# Patient Record
Sex: Male | Born: 1940
Health system: Southern US, Community
[De-identification: ages and names within clinical notes are randomized; demographics above are authoritative.]

## PROBLEM LIST (undated history)

## (undated) DIAGNOSIS — N400 Enlarged prostate without lower urinary tract symptoms: Secondary | ICD-10-CM

## (undated) DIAGNOSIS — Z87442 Personal history of urinary calculi: Secondary | ICD-10-CM

## (undated) DIAGNOSIS — N210 Calculus in bladder: Secondary | ICD-10-CM

## (undated) HISTORY — PX: OTHER SURGICAL HISTORY: SHX169

## (undated) HISTORY — PX: COLONOSCOPY W/ POLYPECTOMY: SHX1380

---

## 2000-07-29 ENCOUNTER — Ambulatory Visit (HOSPITAL_COMMUNITY): Admission: RE | Admit: 2000-07-29 | Discharge: 2000-07-29 | Payer: Self-pay | Admitting: *Deleted

## 2017-03-19 DIAGNOSIS — R69 Illness, unspecified: Secondary | ICD-10-CM | POA: Diagnosis not present

## 2017-10-08 ENCOUNTER — Other Ambulatory Visit: Payer: Self-pay | Admitting: Family Medicine

## 2017-10-08 DIAGNOSIS — R1012 Left upper quadrant pain: Secondary | ICD-10-CM | POA: Diagnosis not present

## 2017-10-08 DIAGNOSIS — R1084 Generalized abdominal pain: Secondary | ICD-10-CM

## 2017-10-08 DIAGNOSIS — K909 Intestinal malabsorption, unspecified: Secondary | ICD-10-CM | POA: Diagnosis not present

## 2017-10-08 DIAGNOSIS — R109 Unspecified abdominal pain: Secondary | ICD-10-CM | POA: Diagnosis not present

## 2017-10-16 ENCOUNTER — Ambulatory Visit
Admission: RE | Admit: 2017-10-16 | Discharge: 2017-10-16 | Disposition: A | Discharge status: Home / Self Care | Payer: Medicare HMO | Source: Ambulatory Visit | Attending: Family Medicine | Admitting: Family Medicine

## 2017-10-16 DIAGNOSIS — R1084 Generalized abdominal pain: Secondary | ICD-10-CM | POA: Diagnosis not present

## 2017-12-08 DIAGNOSIS — R69 Illness, unspecified: Secondary | ICD-10-CM | POA: Diagnosis not present

## 2018-03-18 DIAGNOSIS — R69 Illness, unspecified: Secondary | ICD-10-CM | POA: Diagnosis not present

## 2018-03-23 DIAGNOSIS — H25043 Posterior subcapsular polar age-related cataract, bilateral: Secondary | ICD-10-CM | POA: Diagnosis not present

## 2018-03-23 DIAGNOSIS — H2513 Age-related nuclear cataract, bilateral: Secondary | ICD-10-CM | POA: Diagnosis not present

## 2018-03-23 DIAGNOSIS — H18413 Arcus senilis, bilateral: Secondary | ICD-10-CM | POA: Diagnosis not present

## 2018-03-23 DIAGNOSIS — H2511 Age-related nuclear cataract, right eye: Secondary | ICD-10-CM | POA: Diagnosis not present

## 2018-03-23 DIAGNOSIS — H02831 Dermatochalasis of right upper eyelid: Secondary | ICD-10-CM | POA: Diagnosis not present

## 2018-03-23 DIAGNOSIS — H25013 Cortical age-related cataract, bilateral: Secondary | ICD-10-CM | POA: Diagnosis not present

## 2018-03-25 DIAGNOSIS — R739 Hyperglycemia, unspecified: Secondary | ICD-10-CM | POA: Diagnosis not present

## 2018-03-25 DIAGNOSIS — N400 Enlarged prostate without lower urinary tract symptoms: Secondary | ICD-10-CM | POA: Diagnosis not present

## 2018-03-25 DIAGNOSIS — R7309 Other abnormal glucose: Secondary | ICD-10-CM | POA: Diagnosis not present

## 2018-03-25 DIAGNOSIS — K635 Polyp of colon: Secondary | ICD-10-CM | POA: Diagnosis not present

## 2018-03-25 DIAGNOSIS — Z125 Encounter for screening for malignant neoplasm of prostate: Secondary | ICD-10-CM | POA: Diagnosis not present

## 2018-03-25 DIAGNOSIS — Z0000 Encounter for general adult medical examination without abnormal findings: Secondary | ICD-10-CM | POA: Diagnosis not present

## 2018-03-25 DIAGNOSIS — R1012 Left upper quadrant pain: Secondary | ICD-10-CM | POA: Diagnosis not present

## 2018-04-07 DIAGNOSIS — R1012 Left upper quadrant pain: Secondary | ICD-10-CM | POA: Diagnosis not present

## 2018-04-07 DIAGNOSIS — Z86010 Personal history of colonic polyps: Secondary | ICD-10-CM | POA: Diagnosis not present

## 2018-04-12 DIAGNOSIS — H52201 Unspecified astigmatism, right eye: Secondary | ICD-10-CM | POA: Diagnosis not present

## 2018-04-12 DIAGNOSIS — H2511 Age-related nuclear cataract, right eye: Secondary | ICD-10-CM | POA: Diagnosis not present

## 2018-04-13 DIAGNOSIS — H2512 Age-related nuclear cataract, left eye: Secondary | ICD-10-CM | POA: Diagnosis not present

## 2018-04-28 DIAGNOSIS — K635 Polyp of colon: Secondary | ICD-10-CM | POA: Diagnosis not present

## 2018-04-28 DIAGNOSIS — K640 First degree hemorrhoids: Secondary | ICD-10-CM | POA: Diagnosis not present

## 2018-04-28 DIAGNOSIS — Z86010 Personal history of colonic polyps: Secondary | ICD-10-CM | POA: Diagnosis not present

## 2018-04-30 DIAGNOSIS — K635 Polyp of colon: Secondary | ICD-10-CM | POA: Diagnosis not present

## 2018-05-03 DIAGNOSIS — H5203 Hypermetropia, bilateral: Secondary | ICD-10-CM | POA: Diagnosis not present

## 2018-05-03 DIAGNOSIS — H52223 Regular astigmatism, bilateral: Secondary | ICD-10-CM | POA: Diagnosis not present

## 2018-05-03 DIAGNOSIS — H52202 Unspecified astigmatism, left eye: Secondary | ICD-10-CM | POA: Diagnosis not present

## 2018-05-03 DIAGNOSIS — H2512 Age-related nuclear cataract, left eye: Secondary | ICD-10-CM | POA: Diagnosis not present

## 2018-05-03 DIAGNOSIS — Z961 Presence of intraocular lens: Secondary | ICD-10-CM | POA: Diagnosis not present

## 2018-07-09 DIAGNOSIS — N401 Enlarged prostate with lower urinary tract symptoms: Secondary | ICD-10-CM | POA: Diagnosis not present

## 2018-07-09 DIAGNOSIS — R3912 Poor urinary stream: Secondary | ICD-10-CM | POA: Diagnosis not present

## 2018-07-09 DIAGNOSIS — R3121 Asymptomatic microscopic hematuria: Secondary | ICD-10-CM | POA: Diagnosis not present

## 2018-07-09 DIAGNOSIS — R350 Frequency of micturition: Secondary | ICD-10-CM | POA: Diagnosis not present

## 2018-07-09 DIAGNOSIS — R3911 Hesitancy of micturition: Secondary | ICD-10-CM | POA: Diagnosis not present

## 2018-07-09 DIAGNOSIS — R310 Gross hematuria: Secondary | ICD-10-CM | POA: Diagnosis not present

## 2018-07-14 DIAGNOSIS — R3121 Asymptomatic microscopic hematuria: Secondary | ICD-10-CM | POA: Diagnosis not present

## 2018-07-14 DIAGNOSIS — N210 Calculus in bladder: Secondary | ICD-10-CM | POA: Diagnosis not present

## 2018-07-14 DIAGNOSIS — N200 Calculus of kidney: Secondary | ICD-10-CM | POA: Diagnosis not present

## 2018-07-16 DIAGNOSIS — N210 Calculus in bladder: Secondary | ICD-10-CM | POA: Diagnosis not present

## 2018-07-16 DIAGNOSIS — N401 Enlarged prostate with lower urinary tract symptoms: Secondary | ICD-10-CM | POA: Diagnosis not present

## 2018-07-16 DIAGNOSIS — R3912 Poor urinary stream: Secondary | ICD-10-CM | POA: Diagnosis not present

## 2018-08-13 ENCOUNTER — Other Ambulatory Visit: Payer: Self-pay | Admitting: Urology

## 2018-08-30 ENCOUNTER — Encounter (HOSPITAL_BASED_OUTPATIENT_CLINIC_OR_DEPARTMENT_OTHER): Payer: Self-pay

## 2018-08-30 ENCOUNTER — Ambulatory Visit (HOSPITAL_BASED_OUTPATIENT_CLINIC_OR_DEPARTMENT_OTHER): Admit: 2018-08-30 | Payer: Medicare HMO | Admitting: Urology

## 2018-08-30 SURGERY — CYSTOSCOPY, WITH BLADDER CALCULUS LITHOLAPAXY
Anesthesia: General

## 2018-11-27 DIAGNOSIS — Z23 Encounter for immunization: Secondary | ICD-10-CM | POA: Diagnosis not present

## 2018-12-09 DIAGNOSIS — R69 Illness, unspecified: Secondary | ICD-10-CM | POA: Diagnosis not present

## 2019-04-11 DIAGNOSIS — R739 Hyperglycemia, unspecified: Secondary | ICD-10-CM | POA: Diagnosis not present

## 2019-04-11 DIAGNOSIS — Z125 Encounter for screening for malignant neoplasm of prostate: Secondary | ICD-10-CM | POA: Diagnosis not present

## 2019-04-11 DIAGNOSIS — Z86010 Personal history of colonic polyps: Secondary | ICD-10-CM | POA: Diagnosis not present

## 2019-04-11 DIAGNOSIS — N400 Enlarged prostate without lower urinary tract symptoms: Secondary | ICD-10-CM | POA: Diagnosis not present

## 2019-04-11 DIAGNOSIS — Z1389 Encounter for screening for other disorder: Secondary | ICD-10-CM | POA: Diagnosis not present

## 2019-04-11 DIAGNOSIS — M25571 Pain in right ankle and joints of right foot: Secondary | ICD-10-CM | POA: Diagnosis not present

## 2019-04-11 DIAGNOSIS — Z0000 Encounter for general adult medical examination without abnormal findings: Secondary | ICD-10-CM | POA: Diagnosis not present

## 2019-04-14 DIAGNOSIS — R69 Illness, unspecified: Secondary | ICD-10-CM | POA: Diagnosis not present

## 2019-05-01 ENCOUNTER — Ambulatory Visit: Payer: Medicare HMO

## 2019-07-12 DIAGNOSIS — N210 Calculus in bladder: Secondary | ICD-10-CM | POA: Diagnosis not present

## 2019-07-12 DIAGNOSIS — R351 Nocturia: Secondary | ICD-10-CM | POA: Diagnosis not present

## 2019-07-12 DIAGNOSIS — R310 Gross hematuria: Secondary | ICD-10-CM | POA: Diagnosis not present

## 2019-07-12 DIAGNOSIS — N401 Enlarged prostate with lower urinary tract symptoms: Secondary | ICD-10-CM | POA: Diagnosis not present

## 2019-08-23 DIAGNOSIS — N210 Calculus in bladder: Secondary | ICD-10-CM | POA: Diagnosis not present

## 2019-08-25 DIAGNOSIS — N210 Calculus in bladder: Secondary | ICD-10-CM | POA: Diagnosis not present

## 2019-09-16 DIAGNOSIS — N210 Calculus in bladder: Secondary | ICD-10-CM | POA: Diagnosis not present

## 2019-09-16 DIAGNOSIS — R310 Gross hematuria: Secondary | ICD-10-CM | POA: Diagnosis not present

## 2019-09-16 DIAGNOSIS — N401 Enlarged prostate with lower urinary tract symptoms: Secondary | ICD-10-CM | POA: Diagnosis not present

## 2019-09-16 DIAGNOSIS — R351 Nocturia: Secondary | ICD-10-CM | POA: Diagnosis not present

## 2019-11-30 DIAGNOSIS — R69 Illness, unspecified: Secondary | ICD-10-CM | POA: Diagnosis not present

## 2019-12-06 DIAGNOSIS — R69 Illness, unspecified: Secondary | ICD-10-CM | POA: Diagnosis not present

## 2020-03-20 DIAGNOSIS — R310 Gross hematuria: Secondary | ICD-10-CM | POA: Diagnosis not present

## 2020-03-20 DIAGNOSIS — N401 Enlarged prostate with lower urinary tract symptoms: Secondary | ICD-10-CM | POA: Diagnosis not present

## 2020-03-20 DIAGNOSIS — R351 Nocturia: Secondary | ICD-10-CM | POA: Diagnosis not present

## 2020-04-13 DIAGNOSIS — E559 Vitamin D deficiency, unspecified: Secondary | ICD-10-CM | POA: Diagnosis not present

## 2020-04-13 DIAGNOSIS — Z125 Encounter for screening for malignant neoplasm of prostate: Secondary | ICD-10-CM | POA: Diagnosis not present

## 2020-04-13 DIAGNOSIS — Z86010 Personal history of colonic polyps: Secondary | ICD-10-CM | POA: Diagnosis not present

## 2020-04-13 DIAGNOSIS — N400 Enlarged prostate without lower urinary tract symptoms: Secondary | ICD-10-CM | POA: Diagnosis not present

## 2020-04-13 DIAGNOSIS — Z136 Encounter for screening for cardiovascular disorders: Secondary | ICD-10-CM | POA: Diagnosis not present

## 2020-04-13 DIAGNOSIS — Z0000 Encounter for general adult medical examination without abnormal findings: Secondary | ICD-10-CM | POA: Diagnosis not present

## 2020-04-13 DIAGNOSIS — R739 Hyperglycemia, unspecified: Secondary | ICD-10-CM | POA: Diagnosis not present

## 2020-08-07 DIAGNOSIS — S6991XA Unspecified injury of right wrist, hand and finger(s), initial encounter: Secondary | ICD-10-CM | POA: Diagnosis not present

## 2020-08-07 DIAGNOSIS — S52501A Unspecified fracture of the lower end of right radius, initial encounter for closed fracture: Secondary | ICD-10-CM | POA: Diagnosis not present

## 2020-08-09 DIAGNOSIS — S52571A Other intraarticular fracture of lower end of right radius, initial encounter for closed fracture: Secondary | ICD-10-CM | POA: Diagnosis not present

## 2020-08-10 ENCOUNTER — Other Ambulatory Visit: Payer: Self-pay | Admitting: Orthopedic Surgery

## 2020-08-16 ENCOUNTER — Encounter (HOSPITAL_COMMUNITY): Payer: Self-pay | Admitting: Orthopedic Surgery

## 2020-08-16 ENCOUNTER — Other Ambulatory Visit: Payer: Self-pay

## 2020-08-16 NOTE — H&P (Signed)
Primary Care Provider: Dr. Kelton Pillar Referring Provider: Dr. Kelton Pillar Worker's Comp: No Date of Injury or Onset: 08-07-20  History: CC / Reason for Visit: Right wrist injury HPI: This patient is a 80 year old retired male who presents for evaluation of a right wrist injury that occurred when he [place], sustaining an injury.  He was evaluated at the walk-in urgent care in [facility], and a sugar tong splint was applied.  He thought that he may not need pain medications, but was provided some hydrocodone by his primary physician and has begun taking it.  He presents for further evaluation, his sling not with him.  Past medical history, past surgical history, family history, social history, medications, allergies and review of systems are thoroughly reviewed by me, signed and scanned into SRS today.    Exam:  Vitals: Refer to EMR. Constitutional:  WD, WN, NAD HEENT:  NCAT, EOMI Neuro/Psych:  Alert & oriented to person, place, and time; appropriate mood & affect Lymphatic: No generalized UE edema or lymphadenopathy Extremities / MSK:  Both UE are normal with respect to appearance, ranges of motion, joint stability, muscle strength/tone, sensation, & perfusion except as otherwise noted:  Right upper extremity has a sugar tong splint, but it stops just past the wrist creases, not really extending much onto the hand.  Distal to the edges of the splint the hand and digits are puffy, but with intact light touch sensibility in the radial, median, and ulnar nerve distributions with intact motor to the same.  Digital motion about 50% of normal  Labs / Xrays:  No radiographic studies obtained today.  X-rays that he brought printed on paper reveal a comminuted intra-articular distal radius fracture with accompanying base of ulnar styloid fracture.  There is significant dorsal tilting and slight dorsal translation of the fracture  Assessment: Comminuted  displaced right intra-articular distal radius fracture  Plan:  I discussed these findings with him.  I reviewed his situation using the pictures of the x-rays and plastic models.  I recommended open treatment for skeletal shape restoration, assessment of the distal radial ulnar joint, in an effort to optimize his functional recovery and minimize the risk for long-term pain.  I instructed him in range of motion exercises for the digits and elevation to help prevent stiffness and decrease the swelling in the digits.  We will plan to proceed with operative treatment on Friday the third, with digital block/MAC anesthesia.  I also detailed to pain management strategy with him using multiple medications, including ibuprofen, Tylenol, and the Norco that he has on hand already.  The details of the operative procedure were discussed with the patient.  Questions were invited and answered.  In addition to the goal of the procedure, the risks of the procedure to include but not limited to bleeding; infection; damage to the nerves or blood vessels that could result in bleeding, numbness, weakness, chronic pain, and the need for additional procedures; stiffness; the need for revision surgery; and anesthetic risks were reviewed.  No specific outcome was guaranteed or implied.  Informed consent was obtained.  Autoauthenticated,  Rayvon Char. Grandville Silos, MD

## 2020-08-16 NOTE — Progress Notes (Addendum)
Mr. Steve Andrews denies chest pain or shortness of breath. Mr. Steve Andrews did a home test for Covid on 08/10/20, after being informed by house guest that left on 08/09/20 of their positive test. Mr. Steve Andrews and his wife tested positive for Covid.  Mr. Steve Andrews PCP, Dr. Maurice Andrews put the [facility] on Paxlovid, Mr. Steve Andrews finished the prescription on 08/15/20. Mr. Steve Andrews reports that he is feeling ok, has occasional cough, Mrs. Steve Andrews is having more coughing. I spoke with Sherian Rein, RN, AD, who told me that we have to have a documentation stating that he had a positive and when. Dr. Janee Morn has to be notified that patient tested positive for Covid less than 10 days ago and surgery has to be an emergency, if it has to be done less than 10 days after positive Covid test  I called [facility] and spoke with Casimiro Needle, who is faxing the information the notes to [facility]. I called Dr. Carollee Andrews office and was transferred to [facility], surgical coordinator, voice message , I left information about surgery has to be emergent to be done less than 10 days after positive test was done.and asked her to call me back with Dr. Carollee Andrews plan.  I had told the Steve Andrews that I would have to call them back if there was more information for him.  When I called Mr.Steve Andrews back , patient informed me that Dr. Carollee Andrews office is aware that Mr. Steve Andrews tested positive for Covid, and [facility] was moved [facility] the [facility] to [facility]. I informed Sherian Rein, AD of this and was told that Mr. Steve Andrews's case would be done tomorrow.  I spoke with Mr. Steve Andrews to call the Pre- op desk number when he arrives, wait in the car, until someone comes to escort him in the hospital and that Mrs. Steve Andrews will not be able to come in the hospital.

## 2020-08-17 ENCOUNTER — Ambulatory Visit (HOSPITAL_COMMUNITY): Payer: Medicare HMO

## 2020-08-17 ENCOUNTER — Encounter (HOSPITAL_COMMUNITY)
Admission: RE | Disposition: A | Discharge status: Home / Self Care | Payer: Self-pay | Source: Home / Self Care | Attending: Orthopedic Surgery

## 2020-08-17 ENCOUNTER — Encounter (HOSPITAL_COMMUNITY): Payer: Self-pay | Admitting: Orthopedic Surgery

## 2020-08-17 ENCOUNTER — Ambulatory Visit (HOSPITAL_COMMUNITY)
Admission: RE | Admit: 2020-08-17 | Discharge: 2020-08-17 | Disposition: A | Discharge status: Home / Self Care | Payer: Medicare HMO | Attending: Orthopedic Surgery | Admitting: Orthopedic Surgery

## 2020-08-17 ENCOUNTER — Ambulatory Visit (HOSPITAL_COMMUNITY): Payer: Medicare HMO | Admitting: Anesthesiology

## 2020-08-17 DIAGNOSIS — W010XXA Fall on same level from slipping, tripping and stumbling without subsequent striking against object, initial encounter: Secondary | ICD-10-CM | POA: Insufficient documentation

## 2020-08-17 DIAGNOSIS — Z419 Encounter for procedure for purposes other than remedying health state, unspecified: Secondary | ICD-10-CM

## 2020-08-17 DIAGNOSIS — S52571A Other intraarticular fracture of lower end of right radius, initial encounter for closed fracture: Secondary | ICD-10-CM | POA: Insufficient documentation

## 2020-08-17 DIAGNOSIS — S6291XD Unspecified fracture of right wrist and hand, subsequent encounter for fracture with routine healing: Secondary | ICD-10-CM | POA: Diagnosis not present

## 2020-08-17 DIAGNOSIS — Y9389 Activity, other specified: Secondary | ICD-10-CM | POA: Diagnosis not present

## 2020-08-17 DIAGNOSIS — N400 Enlarged prostate without lower urinary tract symptoms: Secondary | ICD-10-CM | POA: Diagnosis not present

## 2020-08-17 HISTORY — PX: OPEN REDUCTION INTERNAL FIXATION (ORIF) DISTAL RADIAL FRACTURE: SHX5989

## 2020-08-17 HISTORY — DX: Benign prostatic hyperplasia without lower urinary tract symptoms: N40.0

## 2020-08-17 HISTORY — DX: Calculus in bladder: N21.0

## 2020-08-17 HISTORY — DX: Personal history of urinary calculi: Z87.442

## 2020-08-17 SURGERY — OPEN REDUCTION INTERNAL FIXATION (ORIF) DISTAL RADIUS FRACTURE
Anesthesia: Monitor Anesthesia Care | Laterality: Right

## 2020-08-17 MED ORDER — CEFAZOLIN SODIUM-DEXTROSE 2-4 GM/100ML-% IV SOLN
2.0000 g | INTRAVENOUS | Status: AC
  Administered 2020-08-17: 2 g via INTRAVENOUS
  Filled 2020-08-17: qty 100

## 2020-08-17 MED ORDER — BUPIVACAINE HCL (PF) 0.25 % IJ SOLN
INTRAMUSCULAR | Status: AC
  Filled 2020-08-17: qty 30

## 2020-08-17 MED ORDER — FENTANYL CITRATE (PF) 100 MCG/2ML IJ SOLN
100.0000 ug | Freq: Once | INTRAMUSCULAR | Status: AC
  Filled 2020-08-17: qty 2

## 2020-08-17 MED ORDER — LIDOCAINE HCL (PF) 1 % IJ SOLN
INTRAMUSCULAR | Status: AC
  Filled 2020-08-17: qty 30

## 2020-08-17 MED ORDER — BUPIVACAINE HCL (PF) 0.5 % IJ SOLN
INTRAMUSCULAR | Status: AC
  Filled 2020-08-17: qty 30

## 2020-08-17 MED ORDER — ORAL CARE MOUTH RINSE: 15.0000 mL | Freq: Once | OROMUCOSAL | Status: AC

## 2020-08-17 MED ORDER — CELECOXIB 200 MG PO CAPS
ORAL_CAPSULE | ORAL | Status: AC
  Administered 2020-08-17: 200 mg via ORAL
  Filled 2020-08-17: qty 1

## 2020-08-17 MED ORDER — PROPOFOL 10 MG/ML IV BOLUS
INTRAVENOUS | Status: DC | PRN
  Administered 2020-08-17: 20 mg via INTRAVENOUS
  Administered 2020-08-17: 50 mg via INTRAVENOUS

## 2020-08-17 MED ORDER — OXYCODONE HCL 5 MG PO TABS: 5.0000 mg | ORAL_TABLET | Freq: Four times a day (QID) | ORAL | 0 refills | Status: AC | PRN

## 2020-08-17 MED ORDER — ACETAMINOPHEN 500 MG PO TABS: 1000.0000 mg | ORAL_TABLET | Freq: Once | ORAL | Status: AC

## 2020-08-17 MED ORDER — ONDANSETRON HCL 4 MG/2ML IJ SOLN
INTRAMUSCULAR | Status: DC | PRN
  Administered 2020-08-17: 4 mg via INTRAVENOUS

## 2020-08-17 MED ORDER — CHLORHEXIDINE GLUCONATE 0.12 % MT SOLN: 15.0000 mL | Freq: Once | OROMUCOSAL | Status: AC

## 2020-08-17 MED ORDER — PROPOFOL 1000 MG/100ML IV EMUL
INTRAVENOUS | Status: AC
  Filled 2020-08-17: qty 100

## 2020-08-17 MED ORDER — ACETAMINOPHEN 500 MG PO TABS
ORAL_TABLET | ORAL | Status: AC
  Administered 2020-08-17: 1000 mg via ORAL
  Filled 2020-08-17: qty 2

## 2020-08-17 MED ORDER — DEXAMETHASONE SODIUM PHOSPHATE 10 MG/ML IJ SOLN
INTRAMUSCULAR | Status: DC | PRN
  Administered 2020-08-17: 5 mg

## 2020-08-17 MED ORDER — CELECOXIB 200 MG PO CAPS: 200.0000 mg | ORAL_CAPSULE | Freq: Once | ORAL | Status: AC

## 2020-08-17 MED ORDER — PROPOFOL 500 MG/50ML IV EMUL
INTRAVENOUS | Status: DC | PRN
  Administered 2020-08-17: 100 ug/kg/min via INTRAVENOUS
  Administered 2020-08-17: 50 ug/kg/min via INTRAVENOUS

## 2020-08-17 MED ORDER — FENTANYL CITRATE (PF) 100 MCG/2ML IJ SOLN
INTRAMUSCULAR | Status: AC
  Administered 2020-08-17: 100 ug via INTRAVENOUS
  Filled 2020-08-17: qty 2

## 2020-08-17 MED ORDER — ACETAMINOPHEN 325 MG PO TABS: 650.0000 mg | ORAL_TABLET | Freq: Four times a day (QID) | ORAL | Status: AC

## 2020-08-17 MED ORDER — LACTATED RINGERS IV SOLN: INTRAVENOUS | Status: DC

## 2020-08-17 MED ORDER — MIDAZOLAM HCL 2 MG/2ML IJ SOLN
INTRAMUSCULAR | Status: AC
  Filled 2020-08-17: qty 2

## 2020-08-17 MED ORDER — ROPIVACAINE HCL 5 MG/ML IJ SOLN
INTRAMUSCULAR | Status: DC | PRN
  Administered 2020-08-17: 40 mL via PERINEURAL

## 2020-08-17 MED ORDER — GLYCOPYRROLATE PF 0.2 MG/ML IJ SOSY
PREFILLED_SYRINGE | INTRAMUSCULAR | Status: DC | PRN
  Administered 2020-08-17: .2 mg via INTRAVENOUS

## 2020-08-17 MED ORDER — DEXAMETHASONE SODIUM PHOSPHATE 10 MG/ML IJ SOLN
INTRAMUSCULAR | Status: AC
  Filled 2020-08-17: qty 1

## 2020-08-17 MED ORDER — FENTANYL CITRATE (PF) 100 MCG/2ML IJ SOLN: 25.0000 ug | INTRAMUSCULAR | Status: DC | PRN

## 2020-08-17 MED ORDER — 0.9 % SODIUM CHLORIDE (POUR BTL) OPTIME
TOPICAL | Status: DC | PRN
  Administered 2020-08-17: 1000 mL

## 2020-08-17 MED ORDER — FENTANYL CITRATE (PF) 100 MCG/2ML IJ SOLN: 50.0000 ug | Freq: Once | INTRAMUSCULAR | Status: DC

## 2020-08-17 MED ORDER — CHLORHEXIDINE GLUCONATE 0.12 % MT SOLN
OROMUCOSAL | Status: AC
  Administered 2020-08-17: 15 mL via OROMUCOSAL
  Filled 2020-08-17: qty 15

## 2020-08-17 MED ORDER — FENTANYL CITRATE (PF) 250 MCG/5ML IJ SOLN
INTRAMUSCULAR | Status: AC
  Filled 2020-08-17: qty 5

## 2020-08-17 MED ORDER — LIDOCAINE HCL (CARDIAC) PF 100 MG/5ML IV SOSY
PREFILLED_SYRINGE | INTRAVENOUS | Status: DC | PRN
  Administered 2020-08-17: 40 mg via INTRATRACHEAL
  Administered 2020-08-17: 60 mg via INTRATRACHEAL

## 2020-08-17 MED ORDER — ONDANSETRON HCL 4 MG/2ML IJ SOLN
INTRAMUSCULAR | Status: AC
  Filled 2020-08-17: qty 2

## 2020-08-17 MED ORDER — IBUPROFEN 200 MG PO TABS: 600.0000 mg | ORAL_TABLET | Freq: Four times a day (QID) | ORAL | Status: AC

## 2020-08-17 MED ORDER — MIDAZOLAM HCL 2 MG/2ML IJ SOLN
2.0000 mg | Freq: Once | INTRAMUSCULAR | Status: AC
  Filled 2020-08-17: qty 2

## 2020-08-17 MED ORDER — LIDOCAINE 2% (20 MG/ML) 5 ML SYRINGE
INTRAMUSCULAR | Status: AC
  Filled 2020-08-17: qty 5

## 2020-08-17 MED ORDER — MIDAZOLAM HCL 2 MG/2ML IJ SOLN
INTRAMUSCULAR | Status: AC
  Administered 2020-08-17: 2 mg via INTRAVENOUS
  Filled 2020-08-17: qty 2

## 2020-08-17 SURGICAL SUPPLY — 54 items
BAND RUBBER #18 3X1/16 STRL (MISCELLANEOUS) IMPLANT
BIT DRILL 2 FAST STEP (BIT) ×2 IMPLANT
BIT DRILL 2.5X4 QC (BIT) ×2 IMPLANT
BLADE SURG 15 STRL LF DISP TIS (BLADE) ×1 IMPLANT
BLADE SURG 15 STRL SS (BLADE) ×2
BNDG COHESIVE 2X5 TAN STRL LF (GAUZE/BANDAGES/DRESSINGS) IMPLANT
BNDG COHESIVE 4X5 TAN STRL (GAUZE/BANDAGES/DRESSINGS) ×2 IMPLANT
BNDG COHESIVE 6X5 TAN NS LF (GAUZE/BANDAGES/DRESSINGS) ×2 IMPLANT
BNDG ESMARK 4X9 LF (GAUZE/BANDAGES/DRESSINGS) ×2 IMPLANT
BNDG GAUZE ELAST 4 BULKY (GAUZE/BANDAGES/DRESSINGS) ×2 IMPLANT
BRUSH SCRUB EZ PLAIN DRY (MISCELLANEOUS) IMPLANT
CANISTER SUCT 3000ML PPV (MISCELLANEOUS) ×2 IMPLANT
CHLORAPREP W/TINT 26 (MISCELLANEOUS) ×2 IMPLANT
CORD BIPOLAR FORCEPS 12FT (ELECTRODE) ×2 IMPLANT
COVER BACK TABLE 60X90IN (DRAPES) ×2 IMPLANT
COVER SURGICAL LIGHT HANDLE (MISCELLANEOUS) ×2 IMPLANT
COVER WAND RF STERILE (DRAPES) ×2 IMPLANT
CUFF TOURN SGL QUICK 18X4 (TOURNIQUET CUFF) IMPLANT
CUFF TOURN SGL QUICK 24 (TOURNIQUET CUFF)
CUFF TRNQT CYL 24X4X16.5-23 (TOURNIQUET CUFF) IMPLANT
DRAPE C-ARM 42X72 X-RAY (DRAPES) ×2 IMPLANT
DRAPE SURG 17X23 STRL (DRAPES) ×2 IMPLANT
DRSG ADAPTIC 3X8 NADH LF (GAUZE/BANDAGES/DRESSINGS) ×2 IMPLANT
DRSG EMULSION OIL 3X3 NADH (GAUZE/BANDAGES/DRESSINGS) IMPLANT
GAUZE SPONGE 4X4 12PLY STRL (GAUZE/BANDAGES/DRESSINGS) ×2 IMPLANT
GAUZE XEROFORM 5X9 LF (GAUZE/BANDAGES/DRESSINGS) ×2 IMPLANT
GLOVE BIO SURGEON STRL SZ7.5 (GLOVE) ×2 IMPLANT
GLOVE SRG 8 PF TXTR STRL LF DI (GLOVE) ×1 IMPLANT
GLOVE SURG UNDER POLY LF SZ8 (GLOVE) ×2
GOWN STRL REUS W/ TWL XL LVL3 (GOWN DISPOSABLE) ×1 IMPLANT
GOWN STRL REUS W/TWL XL LVL3 (GOWN DISPOSABLE) ×2
KIT BASIN OR (CUSTOM PROCEDURE TRAY) ×2 IMPLANT
NEEDLE HYPO 22GX1.5 SAFETY (NEEDLE) ×2 IMPLANT
NEEDLE HYPO 25X1 1.5 SAFETY (NEEDLE) IMPLANT
NS IRRIG 1000ML POUR BTL (IV SOLUTION) ×2 IMPLANT
PACK ORTHO EXTREMITY (CUSTOM PROCEDURE TRAY) ×2 IMPLANT
PAD CAST 4YDX4 CTTN HI CHSV (CAST SUPPLIES) ×1 IMPLANT
PADDING CAST ABS 4INX4YD NS (CAST SUPPLIES) ×1
PADDING CAST ABS COTTON 4X4 ST (CAST SUPPLIES) ×1 IMPLANT
PADDING CAST COTTON 4X4 STRL (CAST SUPPLIES) ×2
PEG SUBCHONDRAL SMOOTH 2.0X22 (Peg) ×2 IMPLANT
PEG SUBCHONDRAL SMOOTH 2.0X24 (Peg) ×12 IMPLANT
PENCIL BUTTON HOLSTER BLD 10FT (ELECTRODE) IMPLANT
PLATE SHORT 24.4X51.3 RT (Plate) ×2 IMPLANT
SCREW CORT 3.5X14 LNG (Screw) ×4 IMPLANT
SCREW CORT 3.5X16 LNG (Screw) ×2 IMPLANT
SLING ARM FOAM STRAP LRG (SOFTGOODS) ×2 IMPLANT
SUT VIC AB 2-0 CT3 27 (SUTURE) ×2 IMPLANT
SUT VICRYL 4-0 PS2 18IN ABS (SUTURE) IMPLANT
SUT VICRYL RAPIDE 4/0 PS 2 (SUTURE) ×2 IMPLANT
SYR 10ML LL (SYRINGE) IMPLANT
TOWEL GREEN STERILE FF (TOWEL DISPOSABLE) ×2 IMPLANT
TUBE CONNECTING 12X1/4 (SUCTIONS) ×2 IMPLANT
UNDERPAD 30X36 HEAVY ABSORB (UNDERPADS AND DIAPERS) ×2 IMPLANT

## 2020-08-17 NOTE — Progress Notes (Signed)
No labs per Dr. Singer.   

## 2020-08-17 NOTE — Op Note (Addendum)
08/17/2020  1:57 PM  PATIENT:  Steve Andrews  80 y.o. male  PRE-OPERATIVE DIAGNOSIS:  Displaced right intra-articular distal radius fracture  POST-OPERATIVE DIAGNOSIS:  Same  PROCEDURE:  ORIF R displaced intra-articular distal radius fx, 2 fragments, 25608  SURGEON: Rayvon Char. Grandville Silos, MD  PHYSICIAN ASSISTANT: Morley Kos, OPA-C  ANESTHESIA:  regional and MAC  SPECIMENS:  None  DRAINS: None  EBL:  less than 50 mL  PREOPERATIVE INDICATIONS:  Steve Andrews is a  80 y.o. male with a displaced right intra-articular distal radius fx  The risks benefits and alternatives were discussed with the patient preoperatively including but not limited to the risks of infection, bleeding, nerve injury, cardiopulmonary complications, the need for revision surgery, among others, and the patient verbalized understanding and consented to proceed.  OPERATIVE IMPLANTS: Biomet DVR plate/screws/pegs  OPERATIVE PROCEDURE: After receiving prophylactic antibiotics and a regional block, the patient was escorted to the operative theatre and placed in a supine position.   A surgical "time-out" was performed during which the planned procedure, proposed operative site, and the correct patient identity were compared to the operative consent and agreement confirmed by the circulating nurse according to current facility policy. Following application of a tourniquet to the operative extremity, the exposed skin was pre-scrubbed with Hibiclens scrub brush and then was prepped with Chloraprep and draped in the usual sterile fashion. The limb was exsanguinated with an Esmarch bandage and the tourniquet inflated to approximately 118mHg higher than systolic BP.   A sinusoidal-shaped incision was marked and made over the FCR axis and the distal forearm. The skin was incised sharply with scalpel, subcutaneous tissues with blunt and spreading dissection. The FCR axis was exploited deeply. The pronator quadratus was  reflected in an L-shaped ulnarly and the brachioradialis was split in a Z-plasty fashion for later reapproximation. The fracture was inspected and provisionally reduced.  This was confirmed fluoroscopically. The appropriately sized plate was selected and found to fit well. It was placed in its provisional alignment of the radius and this was confirmed fluoroscopically.  It was secured to the radius with a screw through the slotted hole.  Additional adjustments were made as necessary, and the distal holes were all drilled and filled.  Peg/screw length distally was selected on the shorter side of measurements to minimize the risk for dorsal cortical penetration. The remainder of the proximal holes were drilled and filled.   Final images were obtained and the DRUJ was examined for stability. It was found to be sufficiently stable. The wound was then copiously irrigated and the brachioradialis repaired with 2-0 Vicryl Rapide suture followed by repair of the pronator quadratus with the same suture type. Tourniquet was released and additional hemostasis obtained and the skin was closed with 2-0 Vicryl deep dermal buried sutures followed by running 4-0 Vicryl Rapide horizontal mattress suture in the skin. A bulky dressing with a volar plaster component was applied and the patient was taken to the recovery room in stable condition.  DISPOSITION: The patient will be discharged home today with typical post-op instructions, returning in 10-15 days for reevaluation with new x-rays of the affected wrist out of the splint to include an inclined lateral and then transition to therapy to have a custom splint constructed and begin rehabilitation.

## 2020-08-17 NOTE — Anesthesia Procedure Notes (Signed)
Procedure Name: MAC Date/Time: 08/17/2020 2:15 PM Performed by: Michele Rockers, CRNA Pre-anesthesia Checklist: Patient identified, Emergency Drugs available, Suction available, Timeout performed and Patient being monitored Patient Re-evaluated:Patient Re-evaluated prior to induction Oxygen Delivery Method: Simple face mask

## 2020-08-17 NOTE — Interval H&P Note (Signed)
History and Physical Interval Note:  08/17/2020 1:55 PM  Steve Andrews  has presented today for surgery, with the diagnosis of DISPLACED RIGHT DISTAL RADIUS FRACTURE.  The various methods of treatment have been discussed with the patient and family. After consideration of risks, benefits and other options for treatment, the patient has consented to  Procedure(s): OPEN TREATMENT OF DISPLACED RIGHT DISTAL RADIUS FRACTURE (Right) as a surgical intervention.  The patient's history has been reviewed, patient examined, no change in status, stable for surgery.  I have reviewed the patient's chart and labs.  Questions were answered to the patient's satisfaction.     Jodi Marble

## 2020-08-17 NOTE — Transfer of Care (Signed)
Immediate Anesthesia Transfer of Care Note  Patient: Steve Andrews  Procedure(s) Performed: OPEN TREATMENT OF DISPLACED RIGHT DISTAL RADIUS FRACTURE (Right )  Patient Location: PACU  Anesthesia Type:Regional  Level of Consciousness: awake  Airway & Oxygen Therapy: Patient Spontanous Breathing and Patient connected to face mask  Post-op Assessment: Report given to RN and Post -op Vital signs reviewed and stable  Post vital signs: Reviewed and stable  Last Vitals:  Vitals Value Taken Time  BP 103/75 08/17/20 1518  Temp    Pulse 79 08/17/20 1521  Resp 18 08/17/20 1521  SpO2 95 % 08/17/20 1521  Vitals shown include unvalidated device data.  Last Pain:  Vitals:   08/17/20 1309  TempSrc:   PainSc: 0-No pain      Patients Stated Pain Goal: 2 (08/17/20 1143)  Complications: No complications documented.

## 2020-08-17 NOTE — Discharge Instructions (Signed)
Discharge Instructions   You have a dressing with a plaster splint incorporated in it. Move your fingers as much as possible, making a full fist and fully opening the fist. Elevate your hand to reduce pain & swelling of the digits.  Ice over the operative site may be helpful to reduce pain & swelling.  DO NOT USE HEAT. Pain medicine has been prescribed for you.  Take Tylenol 650 mg every 6 hours along with Ibuprofen 600 mg. Take Oxycodone 5 mg as needed for severe post operative pain. Leave the dressing in place until you return to our office.  You may shower, but keep the bandage clean & dry.  You may drive a car when you are off of prescription pain medications and can safely control your vehicle with both hands. Our office will call you to arrange follow-up   Please call [phone] during normal business hours or [phone] after hours for any problems. Including the following:  - excessive redness of the incisions - drainage for more than 4 days - fever of more than 101.5 F  *Please note that pain medications will not be refilled after hours or on weekends.

## 2020-08-17 NOTE — Anesthesia Preprocedure Evaluation (Addendum)
Anesthesia Evaluation  Patient identified by MRN, date of birth, ID band Patient awake    Reviewed: Allergy & Precautions, NPO status , Patient's Chart, lab work & pertinent test results  History of Anesthesia Complications Negative for: history of anesthetic complications  Airway Mallampati: II  TM Distance: >3 FB Neck ROM: Full    Dental no notable dental hx. (+) Dental Advisory Given   Pulmonary neg pulmonary ROS,    Pulmonary exam normal        Cardiovascular negative cardio ROS Normal cardiovascular exam     Neuro/Psych negative neurological ROS     GI/Hepatic negative GI ROS, Neg liver ROS,   Endo/Other  negative endocrine ROS  Renal/GU negative Renal ROS     Musculoskeletal negative musculoskeletal ROS (+)   Abdominal   Peds  Hematology negative hematology ROS (+)   Anesthesia Other Findings   Reproductive/Obstetrics                            Anesthesia Physical Anesthesia Plan  ASA: II  Anesthesia Plan: Regional and MAC   Post-op Pain Management:    Induction:   PONV Risk Score and Plan: Ondansetron and Propofol infusion  Airway Management Planned: Natural Airway, Simple Face Mask and Nasal Cannula  Additional Equipment:   Intra-op Plan:   Post-operative Plan:   Informed Consent: I have reviewed the patients History and Physical, chart, labs and discussed the procedure including the risks, benefits and alternatives for the proposed anesthesia with the patient or authorized representative who has indicated his/her understanding and acceptance.     Dental advisory given  Plan Discussed with: Anesthesiologist and CRNA  Anesthesia Plan Comments:        Anesthesia Quick Evaluation

## 2020-08-17 NOTE — Anesthesia Procedure Notes (Signed)
Anesthesia Regional Block: Interscalene brachial plexus block   Pre-Anesthetic Checklist: ,, timeout performed, Correct Patient, Correct Site, Correct Laterality, Correct Procedure, Correct Position, site marked, Risks and benefits discussed,  Surgical consent,  Pre-op evaluation,  At surgeon's request and post-op pain management  Laterality: Right  Prep: chloraprep       Needles:  Injection technique: Single-shot  Needle Type: Echogenic Stimulator Needle     Needle Length: 5cm  Needle Gauge: 22     Additional Needles:   Narrative:  Start time: 08/17/2020 12:48 PM End time: 08/17/2020 12:59 PM Injection made incrementally with aspirations every 5 mL.  Performed by: Personally  Anesthesiologist: Heather Roberts, MD  Additional Notes: Functioning IV was confirmed and monitors applied.  A 46mm 22ga echogenic arrow stimulator was used. Sterile prep and drape,hand hygiene and sterile gloves were used.Ultrasound guidance: relevant anatomy identified, needle position confirmed, local anesthetic spread visualized around nerve(s)., vascular puncture avoided.  Image printed for medical record.  Negative aspiration and negative test dose prior to incremental administration of local anesthetic. The patient tolerated the procedure well.

## 2020-08-19 NOTE — Anesthesia Postprocedure Evaluation (Signed)
Anesthesia Post Note  Patient: Steve Andrews  Procedure(s) Performed: OPEN TREATMENT OF DISPLACED RIGHT DISTAL RADIUS FRACTURE (Right )     Patient location during evaluation: PACU Anesthesia Type: Regional Level of consciousness: awake and alert Pain management: pain level controlled Vital Signs Assessment: post-procedure vital signs reviewed and stable Respiratory status: spontaneous breathing, nonlabored ventilation, respiratory function stable and patient connected to nasal cannula oxygen Cardiovascular status: stable and blood pressure returned to baseline Postop Assessment: no apparent nausea or vomiting Anesthetic complications: no   No complications documented.  Last Vitals:  Vitals:   08/17/20 1532 08/17/20 1548  BP: 132/85 120/72  Pulse: 93 80  Resp: (!) 23 16  Temp:  36.8 C  SpO2: 94% 93%    Last Pain:  Vitals:   08/17/20 1548  TempSrc:   PainSc: 0-No pain                 Tangelia Sanson DANIEL

## 2020-08-24 ENCOUNTER — Encounter (HOSPITAL_COMMUNITY): Payer: Self-pay | Admitting: Orthopedic Surgery

## 2020-08-24 NOTE — OR Nursing (Signed)
Late entry to correct implants used in case.  I was present as the scrub for this procedure.  Gerlene Fee, RN.

## 2020-08-28 DIAGNOSIS — S52571D Other intraarticular fracture of lower end of right radius, subsequent encounter for closed fracture with routine healing: Secondary | ICD-10-CM | POA: Diagnosis not present

## 2020-08-28 DIAGNOSIS — Z4889 Encounter for other specified surgical aftercare: Secondary | ICD-10-CM | POA: Diagnosis not present

## 2020-09-03 DIAGNOSIS — S52571D Other intraarticular fracture of lower end of right radius, subsequent encounter for closed fracture with routine healing: Secondary | ICD-10-CM | POA: Diagnosis not present

## 2020-09-03 DIAGNOSIS — Z4889 Encounter for other specified surgical aftercare: Secondary | ICD-10-CM | POA: Diagnosis not present

## 2020-09-27 DIAGNOSIS — S52571D Other intraarticular fracture of lower end of right radius, subsequent encounter for closed fracture with routine healing: Secondary | ICD-10-CM | POA: Diagnosis not present

## 2020-10-30 DIAGNOSIS — S52571D Other intraarticular fracture of lower end of right radius, subsequent encounter for closed fracture with routine healing: Secondary | ICD-10-CM | POA: Diagnosis not present

## 2020-11-21 DIAGNOSIS — H6122 Impacted cerumen, left ear: Secondary | ICD-10-CM | POA: Diagnosis not present

## 2020-12-11 DIAGNOSIS — S52571D Other intraarticular fracture of lower end of right radius, subsequent encounter for closed fracture with routine healing: Secondary | ICD-10-CM | POA: Diagnosis not present

## 2020-12-13 DIAGNOSIS — Z23 Encounter for immunization: Secondary | ICD-10-CM | POA: Diagnosis not present

## 2021-01-08 DIAGNOSIS — R399 Unspecified symptoms and signs involving the genitourinary system: Secondary | ICD-10-CM | POA: Diagnosis not present

## 2021-01-08 DIAGNOSIS — N400 Enlarged prostate without lower urinary tract symptoms: Secondary | ICD-10-CM | POA: Diagnosis not present

## 2021-01-14 ENCOUNTER — Other Ambulatory Visit: Payer: Self-pay | Admitting: Family Medicine

## 2021-01-14 DIAGNOSIS — N400 Enlarged prostate without lower urinary tract symptoms: Secondary | ICD-10-CM

## 2021-01-22 ENCOUNTER — Ambulatory Visit
Admission: RE | Admit: 2021-01-22 | Discharge: 2021-01-22 | Disposition: A | Discharge status: Home / Self Care | Payer: Medicare HMO | Source: Ambulatory Visit | Attending: Family Medicine | Admitting: Family Medicine

## 2021-01-22 ENCOUNTER — Other Ambulatory Visit: Payer: Self-pay | Admitting: Interventional Radiology

## 2021-01-22 ENCOUNTER — Encounter: Payer: Self-pay | Admitting: *Deleted

## 2021-01-22 DIAGNOSIS — N400 Enlarged prostate without lower urinary tract symptoms: Secondary | ICD-10-CM

## 2021-01-22 DIAGNOSIS — N401 Enlarged prostate with lower urinary tract symptoms: Secondary | ICD-10-CM | POA: Diagnosis not present

## 2021-01-22 HISTORY — PX: IR RADIOLOGIST EVAL & MGMT: IMG5224

## 2021-01-22 NOTE — Consult Note (Signed)
Chief Complaint: Patient was seen in consultation today for benign prostatic hyperplasia with severe lower urinary tract symptoms at the request of Hammer,Eli  Referring Physician(s): Hammer,Eli  History of Present Illness: RAMEEZ Andrews is a 80 y.o. male who presents in his usual state of health to discuss minimally invasive treatment options for benign prostatic hyperplasia.  Steve Andrews is exceptionally healthy for his age.  He first went to the doctor for routine physical at age 41.  His primary medical issue is significant BPH with associated lower urinary tract symptoms and bladder stone formation.  He underwent cystoscopic surgical removal of his bladder stones with Dr. Lafayette Dragon of [facility] in the past.  The procedure was successful, however he found the recovery somewhat challenging.  His primary symptoms include incomplete emptying, intermittency, and extreme weakness.  I had him fill out the International prostate symptom score (IPSS) questionnaire today.  He scored a 21 out of 35.  This constitutes severe symptoms.  His quality of life due to urinary symptoms was a 3 which is considered mixed.  Overall, he is extremely healthy and this is his only significant and nagging medical issue.  He denies hematuria currently, nausea, vomiting, abdominal pain or other systemic symptoms.  He reports that he has done quite a bit of his own research on other minimally invasive therapies including prostatic urethral lift and Rezume.  Past Medical History:  Diagnosis Date   Bladder stone    BPH (benign prostatic hyperplasia)    History of kidney stones     Past Surgical History:  Procedure Laterality Date   COLONOSCOPY W/ POLYPECTOMY     IR RADIOLOGIST EVAL & MGMT  01/22/2021   lithrotripsy     OPEN REDUCTION INTERNAL FIXATION (ORIF) DISTAL RADIAL FRACTURE Right 08/17/2020   Procedure: OPEN TREATMENT OF DISPLACED RIGHT DISTAL RADIUS FRACTURE;  Surgeon: Milly Jakob, MD;   Location: [place];  Service: Orthopedics;  Laterality: Right;    Allergies: Patient has no known allergies.  Medications: Prior to Admission medications   Medication Sig Start Date End Date Taking? Authorizing Provider  acetaminophen (TYLENOL) 325 MG tablet Take 2 tablets (650 mg total) by mouth every 6 (six) hours. 08/17/20   Milly Jakob, MD  cholecalciferol (VITAMIN D) 25 MCG (1000 UNIT) tablet Take 1,000 Units by mouth in the morning.    [provider]  DM-APAP-CPM (VICKS NYQUIL COLD & FLU NIGHT) 30-650-4 MG/30ML LIQD Take by mouth.    [provider]  ibuprofen (ADVIL) 200 MG tablet Take 3 tablets (600 mg total) by mouth every 6 (six) hours. 08/17/20   Milly Jakob, MD  Multiple Vitamin (MULTIVITAMIN WITH MINERALS) TABS tablet Take 1 tablet by mouth in the morning.    [provider]  Nirmatrelvir & Ritonavir (PAXLOVID) 20 x 150 MG & 10 x 100MG  TBPK Take 3 tablets by mouth in the morning and at bedtime.    [provider]  oxyCODONE (ROXICODONE) 5 MG immediate release tablet Take 1 tablet (5 mg total) by mouth every 6 (six) hours as needed for severe pain. 08/17/20   Milly Jakob, MD  tamsulosin (FLOMAX) 0.4 MG CAPS capsule Take 0.4 mg by mouth at bedtime. 06/24/20   [provider]     No family history on file.  Social History   Socioeconomic History   Marital status: Married    Spouse name: Not on file   Number of children: Not on file   Years of education: Not  on file   Highest education level: Not on file  Occupational History   Not on file  Tobacco Use   Smoking status: Never   Smokeless tobacco: Never  Vaping Use   Vaping Use: Never used  Substance and Sexual Activity   Alcohol use: Yes    Alcohol/week: 12.0 standard drinks    Types: 12 Glasses of wine per week   Drug use: Never   Sexual activity: Not on file  Other Topics Concern   Not on file  Social History Narrative   Not on file   Social Determinants of  Health   Financial Resource Strain: Not on file  Food Insecurity: Not on file  Transportation Needs: Not on file  Physical Activity: Not on file  Stress: Not on file  Social Connections: Not on file    Review of Systems: A 12 point ROS discussed and pertinent positives are indicated in the HPI above.  All other systems are negative.  Review of Systems  Vital Signs: BP (!) 141/72 (BP Location: Left Arm)   Pulse 73   SpO2 97%   Physical Exam Constitutional:      Appearance: Normal appearance.  HENT:     Head: Normocephalic and atraumatic.  Eyes:     General: No scleral icterus. Cardiovascular:     Rate and Rhythm: Normal rate.  Pulmonary:     Effort: Pulmonary effort is normal.  Abdominal:     General: Abdomen is flat.     Tenderness: There is no abdominal tenderness.  Skin:    General: Skin is warm and dry.  Neurological:     Mental Status: He is alert and oriented to person, place, and time.  Psychiatric:        Mood and Affect: Mood normal.        Behavior: Behavior normal.     Imaging: IR Radiologist Eval & Mgmt  Result Date: 01/22/2021 Please refer to notes tab for details about interventional procedure. (Op Note)   Labs:  CBC: No results for input(s): WBC, HGB, HCT, PLT in the last 8760 hours.  COAGS: No results for input(s): INR, APTT in the last 8760 hours.  BMP: No results for input(s): NA, K, CL, CO2, GLUCOSE, BUN, CALCIUM, CREATININE, GFRNONAA, GFRAA in the last 8760 hours.  Invalid input(s): CMP  LIVER FUNCTION TESTS: No results for input(s): BILITOT, AST, ALT, ALKPHOS, PROT, ALBUMIN in the last 8760 hours.  TUMOR MARKERS: No results for input(s): AFPTM, CEA, CA199, CHROMGRNA in the last 8760 hours.  Assessment and Plan:  Extremely pleasant 80 year old gentleman with benign prostatic hyperplasia (BPH) and significant lower urinary tract symptoms (LUTS).  His international prostate symptom score (IPSS) is 21 output of 35 which constitutes  severe lower urinary tract symptoms.  Quality of life due to urinary symptoms is a 3 out of 6.  I discussed the various treatment options for BPH with associated with lower urinary tract symptoms. 40 minutes was spent in consultation discussing alternative therapies including TURP, and minimally invasive procedures including Urolift and Rezum. Given the nature of the disease, and his prior experience with bladder stone removal, I felt that the patient would be best treated with prostate artery embolization (PAE) at this time. The risks, benefits and alternatives of PAE were discussed including technique, potential complications, timeline of improvement, efficacy and durability.   1.) CTA pelvis (PAE protocol) to be done now to assess anatomy 2.) Schedule for PAE to be done at [facility], preferably on Monday 12/19  pending insurance approval.   Thank you for this interesting consult.  I greatly enjoyed meeting AVALON ZUBIA and look forward to participating in their care.  A copy of this report was sent to the requesting provider on this date.  Electronically Signed: Criselda Peaches 01/22/2021, 9:09 AM   I spent a total of  40 Minutes  in face to face in clinical consultation, greater than 50% of which was counseling/coordinating care for BPH with severe LUTS.

## 2021-02-06 DIAGNOSIS — H52222 Regular astigmatism, left eye: Secondary | ICD-10-CM | POA: Diagnosis not present

## 2021-02-06 DIAGNOSIS — H35711 Central serous chorioretinopathy, right eye: Secondary | ICD-10-CM | POA: Diagnosis not present

## 2021-02-21 ENCOUNTER — Other Ambulatory Visit: Payer: Self-pay | Admitting: Interventional Radiology

## 2021-02-21 DIAGNOSIS — N401 Enlarged prostate with lower urinary tract symptoms: Secondary | ICD-10-CM

## 2021-03-01 ENCOUNTER — Ambulatory Visit (HOSPITAL_COMMUNITY)
Admission: RE | Admit: 2021-03-01 | Discharge: 2021-03-01 | Disposition: A | Discharge status: Home / Self Care | Payer: Medicare HMO | Source: Ambulatory Visit | Attending: Interventional Radiology | Admitting: Interventional Radiology

## 2021-03-01 DIAGNOSIS — N401 Enlarged prostate with lower urinary tract symptoms: Secondary | ICD-10-CM | POA: Insufficient documentation

## 2021-03-01 DIAGNOSIS — K5730 Diverticulosis of large intestine without perforation or abscess without bleeding: Secondary | ICD-10-CM | POA: Diagnosis not present

## 2021-03-01 LAB — POCT I-STAT CREATININE: Creatinine, Ser: 0.7 mg/dL (ref 0.61–1.24)

## 2021-03-01 MED ORDER — SODIUM CHLORIDE (PF) 0.9 % IJ SOLN
INTRAMUSCULAR | Status: AC
  Filled 2021-03-01: qty 50

## 2021-03-01 MED ORDER — IOHEXOL 350 MG/ML SOLN
80.0000 mL | Freq: Once | INTRAVENOUS | Status: AC | PRN
  Administered 2021-03-01: 80 mL via INTRAVENOUS

## 2021-03-03 ENCOUNTER — Encounter: Payer: Self-pay | Admitting: Radiology

## 2021-03-04 ENCOUNTER — Other Ambulatory Visit: Payer: Self-pay | Admitting: Radiology

## 2021-03-05 ENCOUNTER — Other Ambulatory Visit: Payer: Self-pay | Admitting: Radiology

## 2021-03-05 ENCOUNTER — Ambulatory Visit (HOSPITAL_COMMUNITY)
Admission: RE | Admit: 2021-03-05 | Discharge: 2021-03-05 | Disposition: A | Discharge status: Home / Self Care | Payer: Medicare HMO | Source: Ambulatory Visit | Attending: Interventional Radiology | Admitting: Interventional Radiology

## 2021-03-05 ENCOUNTER — Encounter (HOSPITAL_COMMUNITY): Payer: Self-pay

## 2021-03-05 ENCOUNTER — Other Ambulatory Visit: Payer: Self-pay

## 2021-03-05 ENCOUNTER — Other Ambulatory Visit: Payer: Self-pay | Admitting: Interventional Radiology

## 2021-03-05 ENCOUNTER — Ambulatory Visit (HOSPITAL_COMMUNITY)
Admission: RE | Admit: 2021-03-05 | Discharge: 2021-03-05 | Disposition: A | Discharge status: Home / Self Care | Payer: Medicare HMO | Source: Ambulatory Visit

## 2021-03-05 DIAGNOSIS — N401 Enlarged prostate with lower urinary tract symptoms: Secondary | ICD-10-CM

## 2021-03-05 DIAGNOSIS — N200 Calculus of kidney: Secondary | ICD-10-CM | POA: Diagnosis not present

## 2021-03-05 DIAGNOSIS — Z419 Encounter for procedure for purposes other than remedying health state, unspecified: Secondary | ICD-10-CM

## 2021-03-05 HISTORY — PX: IR ANGIOGRAM SELECTIVE EACH ADDITIONAL VESSEL: IMG667

## 2021-03-05 HISTORY — PX: IR US GUIDE VASC ACCESS RIGHT: IMG2390

## 2021-03-05 HISTORY — PX: IR 3D INDEPENDENT WKST: IMG2385

## 2021-03-05 HISTORY — PX: IR ANGIOGRAM PELVIS SELECTIVE OR SUPRASELECTIVE: IMG661

## 2021-03-05 HISTORY — PX: IR EMBO TUMOR ORGAN ISCHEMIA INFARCT INC GUIDE ROADMAPPING: IMG5449

## 2021-03-05 LAB — PROTIME-INR
INR: 1 (ref 0.8–1.2)
Prothrombin Time: 13.1 seconds (ref 11.4–15.2)

## 2021-03-05 LAB — COMPREHENSIVE METABOLIC PANEL
ALT: 18 U/L (ref 0–44)
AST: 18 U/L (ref 15–41)
Albumin: 3.8 g/dL (ref 3.5–5.0)
Alkaline Phosphatase: 65 U/L (ref 38–126)
Anion gap: 9 (ref 5–15)
BUN: 14 mg/dL (ref 8–23)
CO2: 25 mmol/L (ref 22–32)
Calcium: 8.9 mg/dL (ref 8.9–10.3)
Chloride: 105 mmol/L (ref 98–111)
Creatinine, Ser: 0.56 mg/dL — ABNORMAL LOW (ref 0.61–1.24)
GFR, Estimated: >60 mL/min (ref >60)
Glucose, Bld: 124 mg/dL — ABNORMAL HIGH (ref 70–99)
Potassium: 3.9 mmol/L (ref 3.5–5.1)
Sodium: 139 mmol/L (ref 135–145)
Total Bilirubin: 1 mg/dL (ref 0.3–1.2)
Total Protein: 6.8 g/dL (ref 6.5–8.1)

## 2021-03-05 LAB — CBC WITH DIFFERENTIAL/PLATELET
Abs Immature Granulocytes: 0.02 10*3/uL (ref 0.00–0.07)
Basophils Absolute: 0 10*3/uL (ref 0.0–0.1)
Basophils Relative: 0 %
Eosinophils Absolute: 0.1 10*3/uL (ref 0.0–0.5)
Eosinophils Relative: 2 %
HCT: 44.7 % (ref 39.0–52.0)
Hemoglobin: 15.1 g/dL (ref 13.0–17.0)
Immature Granulocytes: 0 %
Lymphocytes Relative: 32 %
Lymphs Abs: 1.5 10*3/uL (ref 0.7–4.0)
MCH: 31 pg (ref 26.0–34.0)
MCHC: 33.8 g/dL (ref 30.0–36.0)
MCV: 91.8 fL (ref 80.0–100.0)
Monocytes Absolute: 0.4 10*3/uL (ref 0.1–1.0)
Monocytes Relative: 8 %
Neutro Abs: 2.7 10*3/uL (ref 1.7–7.7)
Neutrophils Relative %: 58 %
Platelets: 181 10*3/uL (ref 150–400)
RBC: 4.87 MIL/uL (ref 4.22–5.81)
RDW: 13.3 % (ref 11.5–15.5)
WBC: 4.7 10*3/uL (ref 4.0–10.5)
nRBC: 0 % (ref 0.0–0.2)

## 2021-03-05 MED ORDER — OXYCODONE-ACETAMINOPHEN 5-325 MG PO TABS: 1.0000 | ORAL_TABLET | Freq: Four times a day (QID) | ORAL | 0 refills | Status: AC | PRN

## 2021-03-05 MED ORDER — LIDOCAINE HCL (PF) 1 % IJ SOLN
INTRAMUSCULAR | Status: AC | PRN
  Administered 2021-03-05: 10 mL via INTRADERMAL

## 2021-03-05 MED ORDER — NITROGLYCERIN 1 MG/10 ML FOR IR/CATH LAB
INTRA_ARTERIAL | Status: AC | PRN
  Administered 2021-03-05: 200 ug via INTRA_ARTERIAL

## 2021-03-05 MED ORDER — FENTANYL CITRATE (PF) 100 MCG/2ML IJ SOLN
INTRAMUSCULAR | Status: AC
  Filled 2021-03-05: qty 2

## 2021-03-05 MED ORDER — FENTANYL CITRATE (PF) 100 MCG/2ML IJ SOLN
INTRAMUSCULAR | Status: AC | PRN
  Administered 2021-03-05: 25 ug via INTRAVENOUS

## 2021-03-05 MED ORDER — LEVOFLOXACIN IN D5W 500 MG/100ML IV SOLN
500.0000 mg | Freq: Once | INTRAVENOUS | Status: DC
  Filled 2021-03-05: qty 100

## 2021-03-05 MED ORDER — SODIUM CHLORIDE 0.9 % IV SOLN: INTRAVENOUS | Status: DC

## 2021-03-05 MED ORDER — MIDAZOLAM HCL 2 MG/2ML IJ SOLN
INTRAMUSCULAR | Status: AC | PRN
  Administered 2021-03-05: 1 mg via INTRAVENOUS

## 2021-03-05 MED ORDER — SULFAMETHOXAZOLE-TRIMETHOPRIM 400-80 MG PO TABS
1.0000 | ORAL_TABLET | Freq: Two times a day (BID) | ORAL | Status: DC
  Administered 2021-03-05: 09:00:00 1 via ORAL
  Filled 2021-03-05: qty 1

## 2021-03-05 MED ORDER — MIDAZOLAM HCL 2 MG/2ML IJ SOLN
INTRAMUSCULAR | Status: AC | PRN
  Administered 2021-03-05: .5 mg via INTRAVENOUS

## 2021-03-05 MED ORDER — FENTANYL CITRATE (PF) 100 MCG/2ML IJ SOLN
INTRAMUSCULAR | Status: AC
  Filled 2021-03-05: qty 4

## 2021-03-05 MED ORDER — NAPROXEN 250 MG PO TABS
500.0000 mg | ORAL_TABLET | Freq: Once | ORAL | Status: AC
  Administered 2021-03-05: 09:00:00 500 mg via ORAL
  Filled 2021-03-05: qty 2

## 2021-03-05 MED ORDER — MIDAZOLAM HCL 2 MG/2ML IJ SOLN
INTRAMUSCULAR | Status: AC
  Filled 2021-03-05: qty 4

## 2021-03-05 MED ORDER — DEXAMETHASONE SODIUM PHOSPHATE 10 MG/ML IJ SOLN
8.0000 mg | Freq: Once | INTRAMUSCULAR | Status: AC
  Administered 2021-03-05: 09:00:00 8 mg via INTRAVENOUS
  Filled 2021-03-05: qty 1

## 2021-03-05 MED ORDER — SULFAMETHOXAZOLE-TRIMETHOPRIM 400-80 MG PO TABS: 1.0000 | ORAL_TABLET | Freq: Two times a day (BID) | ORAL | 0 refills | Status: AC

## 2021-03-05 MED ORDER — HYDROCODONE-ACETAMINOPHEN 5-325 MG PO TABS: 1.0000 | ORAL_TABLET | ORAL | Status: DC | PRN

## 2021-03-05 MED ORDER — MIDAZOLAM HCL 2 MG/2ML IJ SOLN
INTRAMUSCULAR | Status: AC
  Filled 2021-03-05: qty 2

## 2021-03-05 MED ORDER — NITROGLYCERIN IN D5W 100-5 MCG/ML-% IV SOLN
INTRAVENOUS | Status: AC
  Filled 2021-03-05: qty 250

## 2021-03-05 MED ORDER — LIDOCAINE HCL (PF) 1 % IJ SOLN
INTRAMUSCULAR | Status: AC
  Filled 2021-03-05: qty 30

## 2021-03-05 MED ORDER — METHYLPREDNISOLONE 4 MG PO TBPK: ORAL_TABLET | ORAL | 0 refills | Status: AC

## 2021-03-05 MED ORDER — IOHEXOL 300 MG/ML  SOLN
100.0000 mL | Freq: Once | INTRAMUSCULAR | Status: AC | PRN
  Administered 2021-03-05: 13:00:00 60 mL via INTRA_ARTERIAL

## 2021-03-05 MED ORDER — NAPROXEN 500 MG PO TABS: 500.0000 mg | ORAL_TABLET | Freq: Two times a day (BID) | ORAL | 0 refills | Status: AC

## 2021-03-05 MED ORDER — FENTANYL CITRATE (PF) 100 MCG/2ML IJ SOLN
INTRAMUSCULAR | Status: AC | PRN
  Administered 2021-03-05: 50 ug via INTRAVENOUS

## 2021-03-05 MED ORDER — TAMSULOSIN HCL 0.4 MG PO CAPS: 0.4000 mg | ORAL_CAPSULE | Freq: Every day | ORAL | 3 refills | Status: DC

## 2021-03-05 MED ORDER — IOHEXOL 300 MG/ML  SOLN
100.0000 mL | Freq: Once | INTRAMUSCULAR | Status: AC | PRN
  Administered 2021-03-05: 13:00:00 50 mL via INTRA_ARTERIAL

## 2021-03-05 NOTE — Procedures (Signed)
Interventional Radiology Procedure Note  Procedure: Prostatic artery embolizatrion  Complications: None  Estimated Blood Loss: None  Recommendations: - Bedrest x 1 hr - DC home - Bactrim, pain control   Signed,  Sterling Big, MD

## 2021-03-05 NOTE — H&P (Signed)
Chief Complaint: Symptomatic benign prostatic hyperplasia. Patient presents for prostate artery embolization.  Referring Physician(s): Dr. Parthenia Ames  Supervising Physician: Malachy Moan  Patient Status: [facility] - Out-pt  History of Present Illness: Steve Andrews is a 80 y.o. male history of bladder and kidney stones s/pa lithotripsy, benign symptomatic prostatic hyperplasia. CT Abd Pelvis from 12.16.22 shows unremarkable vascular anatomy. The patient was seen for consultation in the Interventional Radiology Clinic  on 11.8.22 with Dr. Vella Redhead. Per note from Dr. Archer [employer] prostate symptom score (IPSS) questionnaire today.  He scored a 21 out of 35. The patient was given several different treatment options and the patient decided to pursue prostate embolization Patient presents for prostate embolization  Currently without any significant complaints. Patient alert and laying in bed, calm and comfortable. Denies any fevers, headache, chest pain, SOB, cough, abdominal pain, nausea, vomiting or bleeding. Return precautions and treatment recommendations and follow-up discussed with the patient  who is agreeable with the plan.    Past Medical History:  Diagnosis Date   Bladder stone    BPH (benign prostatic hyperplasia)    History of kidney stones     Past Surgical History:  Procedure Laterality Date   COLONOSCOPY W/ POLYPECTOMY     IR RADIOLOGIST EVAL & MGMT  01/22/2021   lithrotripsy     OPEN REDUCTION INTERNAL FIXATION (ORIF) DISTAL RADIAL FRACTURE Right 08/17/2020   Procedure: OPEN TREATMENT OF DISPLACED RIGHT DISTAL RADIUS FRACTURE;  Surgeon: Mack Hook, MD;  Location: [facility] OR;  Service: Orthopedics;  Laterality: Right;    Allergies: Patient has no known allergies.  Medications: Prior to Admission medications   Medication Sig Start Date End Date Taking? Authorizing Provider  acetaminophen (TYLENOL) 325 MG tablet Take 2 tablets (650 mg total) by  mouth every 6 (six) hours. 08/17/20   Mack Hook, MD  cholecalciferol (VITAMIN D) 25 MCG (1000 UNIT) tablet Take 1,000 Units by mouth in the morning.    [provider]  DM-APAP-CPM (VICKS NYQUIL COLD & FLU NIGHT) 30-650-4 MG/30ML LIQD Take by mouth.    [provider]  ibuprofen (ADVIL) 200 MG tablet Take 3 tablets (600 mg total) by mouth every 6 (six) hours. 08/17/20   Mack Hook, MD  Multiple Vitamin (MULTIVITAMIN WITH MINERALS) TABS tablet Take 1 tablet by mouth in the morning.    [provider]  Nirmatrelvir & Ritonavir (PAXLOVID) 20 x 150 MG & 10 x 100MG  TBPK Take 3 tablets by mouth in the morning and at bedtime.    [provider]  oxyCODONE (ROXICODONE) 5 MG immediate release tablet Take 1 tablet (5 mg total) by mouth every 6 (six) hours as needed for severe pain. 08/17/20   10/17/20, MD  tamsulosin (FLOMAX) 0.4 MG CAPS capsule Take 0.4 mg by mouth at bedtime. 06/24/20   [provider]     No family history on file.  Social History   Socioeconomic History   Marital status: Married    Spouse name: Not on file   Number of children: Not on file   Years of education: Not on file   Highest education level: Not on file  Occupational History   Not on file  Tobacco Use   Smoking status: Never   Smokeless tobacco: Never  Vaping Use   Vaping Use: Never used  Substance and Sexual Activity   Alcohol use: Yes    Alcohol/week: 12.0 standard drinks    Types: 12 Glasses of wine per week  Drug use: Never   Sexual activity: Not on file  Other Topics Concern   Not on file  Social History Narrative   Not on file   Social Determinants of Health   Financial Resource Strain: Not on file  Food Insecurity: Not on file  Transportation Needs: Not on file  Physical Activity: Not on file  Stress: Not on file  Social Connections: Not on file    Review of Systems: A 12 point ROS discussed and pertinent positives are indicated in the  HPI above.  All other systems are negative.  Review of Systems  Constitutional:  Negative for fever.  HENT:  Negative for congestion.   Respiratory:  Negative for cough and shortness of breath.   Cardiovascular:  Negative for chest pain.  Gastrointestinal:  Negative for abdominal pain.  Neurological:  Negative for headaches.  Psychiatric/Behavioral:  Negative for behavioral problems and confusion.    Vital Signs: BP 133/72    Pulse 72    Temp 98.1 F (36.7 C) (Oral)    Resp 18    SpO2 100%   Physical Exam Vitals and nursing note reviewed.  Constitutional:      Appearance: He is well-developed.  HENT:     Head: Normocephalic.  Cardiovascular:     Rate and Rhythm: Normal rate and regular rhythm.     Heart sounds: Normal heart sounds.  Pulmonary:     Effort: Pulmonary effort is normal.     Breath sounds: Normal breath sounds.  Musculoskeletal:        General: Normal range of motion.     Cervical back: Normal range of motion.  Skin:    General: Skin is dry.  Neurological:     Mental Status: He is alert and oriented to person, place, and time.    Imaging: CT ANGIO PELVIS W OR WO CONTRAST  Result Date: 03/01/2021 CLINICAL DATA:  Benign prostate hyperplasia with significant lower urinary tract symptoms. Prostate artery embolization preprocedure planning to assess anatomy EXAM: CT ANGIOGRAPHY PELVIS TECHNIQUE: Multidetector CT imaging through the pelvis was performed using the standard protocol during bolus administration of intravenous contrast. Multiplanar reconstructed images and MIPs were obtained and reviewed to evaluate the vascular anatomy. CONTRAST:  80mL OMNIPAQUE IOHEXOL 350 MG/ML SOLN COMPARISON:  07/14/2018 FINDINGS: CTA PELVIS FINDINGS VASCULAR Aorta: Very minor aortic atherosclerosis without acute aortic process, aneurysm, dissection, or occlusive disease. No retroperitoneal hemorrhage or hematoma. No surrounding inflammatory process. Celiac: Widely patent origin  including its branches SMA: Widely patent origin including its branches Renals: Main renal arteries are widely patent. No accessory renal artery appreciated. IMA: Remains widely patent off the distal aorta. Inflow: Pelvic iliac vasculature are widely patent. Slight tortuosity and minor atherosclerotic change. The common, internal, and external iliac arteries are widely patent. No pelvic inflow disease or occlusion. Internal iliac arteries are patent bilaterally including the small peripheral branches along the pelvic sidewall and the enlarged prostate gland. Internal iliac vascular anatomy appears favorable for prostate artery embolization. Veins: No veno-occlusive process. Review of the MIP images confirms the above findings. NON-VASCULAR Hepatobiliary: Inferior portion of the liver is unremarkable. Gallbladder nondistended. Common bile duct nondilated. Pancreas: Unremarkable. No pancreatic ductal dilatation or surrounding inflammatory changes. Spleen: Imaged portion of the spleen unremarkable. Adrenals/Urinary Tract: No adrenal abnormality. No renal obstruction or hydronephrosis. Nonobstructing left mid to lower pole calculus measures 8 mm. No hydroureter or ureteral calculus. Previous bladder calculi have been removed. Stomach/Bowel: Negative for bowel obstruction, significant dilatation, ileus, or free air.  Minor scattered colonic diverticulosis. Appendix not visualized. No acute inflammatory process. No free fluid, fluid collection, hemorrhage, hematoma, abscess or ascites. Lymphatic: No bulky adenopathy. Reproductive: Marked enlargement of the prostate gland measuring 8.2 x 7 x 6 x 7.7 cm. Scattered calcifications noted. Other: No abdominal wall hernia or abnormality. No abdominopelvic ascites. Musculoskeletal: Degenerative changes of the spine most pronounced at L5-S1. No acute osseous finding. Review of the MIP images confirms the above findings. IMPRESSION: Very minor aortoiliac atherosclerosis. Patent  mesenteric and renal vasculature. Patent pelvic iliac vasculature as detailed above without inflow disease or occlusion. Internal iliac arteries and peripheral branches are widely patent in the pelvis. Vascular anatomy appears favorable for prostate artery embolization. Nonobstructing left nephrolithiasis Markedly enlarged prostate gland compatible with known BPH. Electronically Signed   By: Judie Petit.  Shick M.D.   On: 03/01/2021 10:43    Labs:  CBC: No results for input(s): WBC, HGB, HCT, PLT in the last 8760 hours.  COAGS: No results for input(s): INR, APTT in the last 8760 hours.  BMP: Recent Labs    03/01/21 0808  CREATININE 0.70    LIVER FUNCTION TESTS: No results for input(s): BILITOT, AST, ALT, ALKPHOS, PROT, ALBUMIN in the last 8760 hours.   Assessment and Plan:  80 y.o male outpatient. History of bladder and kidney stones s/pa lithotripsy, benign symptomatic prostatic hyperplasia. CT Abd Pelvis from 12.16.22 shows unremarkable vascular anatomy. The patient was seen for consultation in the Interventional Radiology Clinic  on 11.8.22 with Dr. Vella Redhead. Per note from Dr. Archer [employer] prostate symptom score (IPSS) questionnaire today.  He scored a 21 out of 35. The patient was given several different treatment options and the patient decided to pursue prostate embolization Patient presents for prostate embolization.   No PSA. All labs and medications are within acceptable parameters. NKDA. Patient has ben NPO since midnight.   The Risks and benefits of embolization were discussed with the patient including, but not limited to bleeding, infection, vascular injury, post operative pain, or contrast induced renal failure.  This procedure involves the use of X-rays and because of the nature of the planned procedure, it is possible that we will have prolonged use of X-ray fluoroscopy.  Potential radiation risks to you include (but are not limited to) the following: - A  slightly elevated risk for cancer several years later in life. This risk is typically less than 0.5% percent. This risk is low in comparison to the normal incidence of human cancer, which is 33% for women and 50% for men according to the [employer] Society. - Radiation induced injury can include skin redness, resembling a rash, tissue breakdown / ulcers and hair loss (which can be temporary or permanent).   The likelihood of either of these occurring depends on the difficulty of the procedure and whether you are sensitive to radiation due to previous procedures, disease, or genetic conditions.   IF your procedure requires a prolonged use of radiation, you will be notified and given written instructions for further action.  It is your responsibility to monitor the irradiated area for the 2 weeks following the procedure and to notify your physician if you are concerned that you have suffered a radiation induced injury.    All of the patient's questions were answered, patient is agreeable to proceed. Consent signed and in chart.   Thank you for this interesting consult.  I greatly enjoyed meeting JEHIEL KOEPP and look forward to participating in their care.  A copy of  this report was sent to the requesting provider on this date.  Electronically Signed: Alene Mires, NP 03/05/2021, 8:08 AM   I spent a total of  40 Minutes   in face to face in clinical consultation, greater than 50% of which was counseling/coordinating care for

## 2021-03-26 DIAGNOSIS — H353211 Exudative age-related macular degeneration, right eye, with active choroidal neovascularization: Secondary | ICD-10-CM | POA: Diagnosis not present

## 2021-03-26 DIAGNOSIS — H35372 Puckering of macula, left eye: Secondary | ICD-10-CM | POA: Diagnosis not present

## 2021-03-26 DIAGNOSIS — H43822 Vitreomacular adhesion, left eye: Secondary | ICD-10-CM | POA: Diagnosis not present

## 2021-03-27 ENCOUNTER — Encounter: Payer: Self-pay | Admitting: *Deleted

## 2021-03-27 ENCOUNTER — Other Ambulatory Visit: Payer: Self-pay

## 2021-03-27 ENCOUNTER — Ambulatory Visit
Admission: RE | Admit: 2021-03-27 | Discharge: 2021-03-27 | Disposition: A | Discharge status: Home / Self Care | Payer: Medicare HMO | Source: Ambulatory Visit | Attending: Radiology | Admitting: Radiology

## 2021-03-27 DIAGNOSIS — Z9889 Other specified postprocedural states: Secondary | ICD-10-CM | POA: Diagnosis not present

## 2021-03-27 DIAGNOSIS — Z419 Encounter for procedure for purposes other than remedying health state, unspecified: Secondary | ICD-10-CM

## 2021-03-27 DIAGNOSIS — N401 Enlarged prostate with lower urinary tract symptoms: Secondary | ICD-10-CM | POA: Diagnosis not present

## 2021-03-27 HISTORY — PX: IR RADIOLOGIST EVAL & MGMT: IMG5224

## 2021-03-27 NOTE — Progress Notes (Signed)
Chief Complaint: Patient was consulted remotely today (TeleHealth) for at the request of Omohundro,Jennifer C.    Referring Physician(s): Irven Coe  History of Present Illness: Steve Andrews is a 81 y.o. male who presented in November of 2022 in his usual state of health to discuss minimally invasive treatment options for benign prostatic hyperplasia.  Steve Andrews is exceptionally healthy for his age.  He first went to the doctor for routine physical at age 46.  His primary medical issue is significant BPH with associated lower urinary tract symptoms and bladder stone formation.  He underwent cystoscopic surgical removal of his bladder stones with Dr. Janeece Agee of [facility] in the past.  The procedure was successful, however he found the recovery somewhat challenging.  His primary symptoms include incomplete emptying, intermittency, and extreme weakness.  I had him fill out the International prostate symptom score (IPSS) questionnaire at his first visit.  He scored a 21 out of 35.  This constituted severe symptoms.  His quality of life due to urinary symptoms was a 3 which is considered mixed.  Overall, he is extremely healthy and this is his only significant and nagging medical issue.  He underwent prostatic artery embolization on 03/05/2021.  The procedure was successful and uncomplicated.  We are talking over the phone today for his initial follow-up evaluation.  Steve Andrews tells me that his recovery was uneventful, and frankly quite easy.  He had no significant pain, dysuria or hematuria after the first day.  He notes that his symptoms are beginning to improve.  He notices an approximately 20% improvement in his overall lower urinary tract symptoms.  Past Medical History:  Diagnosis Date   Bladder stone    BPH (benign prostatic hyperplasia)    History of kidney stones     Past Surgical History:  Procedure Laterality Date   COLONOSCOPY W/ POLYPECTOMY     IR 3D  INDEPENDENT WKST  03/05/2021   IR ANGIOGRAM PELVIS SELECTIVE OR SUPRASELECTIVE  03/05/2021   IR ANGIOGRAM PELVIS SELECTIVE OR SUPRASELECTIVE  03/05/2021   IR ANGIOGRAM SELECTIVE EACH ADDITIONAL VESSEL  03/05/2021   IR ANGIOGRAM SELECTIVE EACH ADDITIONAL VESSEL  03/05/2021   IR EMBO TUMOR ORGAN ISCHEMIA INFARCT INC GUIDE ROADMAPPING  03/05/2021   IR RADIOLOGIST EVAL & MGMT  01/22/2021   IR US GUIDE VASC ACCESS RIGHT  03/05/2021   lithrotripsy     OPEN REDUCTION INTERNAL FIXATION (ORIF) DISTAL RADIAL FRACTURE Right 08/17/2020   Procedure: OPEN TREATMENT OF DISPLACED RIGHT DISTAL RADIUS FRACTURE;  Surgeon: Mack Hook, MD;  Location: [facility] OR;  Service: Orthopedics;  Laterality: Right;    Allergies: Patient has no known allergies.  Medications: Prior to Admission medications   Medication Sig Start Date End Date Taking? Authorizing Provider  acetaminophen (TYLENOL) 325 MG tablet Take 2 tablets (650 mg total) by mouth every 6 (six) hours. 08/17/20   Mack Hook, MD  cholecalciferol (VITAMIN D) 25 MCG (1000 UNIT) tablet Take 1,000 Units by mouth in the morning.    [provider]  DM-APAP-CPM (VICKS NYQUIL COLD & FLU NIGHT) 30-650-4 MG/30ML LIQD Take by mouth.    [provider]  ibuprofen (ADVIL) 200 MG tablet Take 3 tablets (600 mg total) by mouth every 6 (six) hours. 08/17/20   Mack Hook, MD  methylPREDNISolone (MEDROL DOSEPAK) 4 MG TBPK tablet Taper dose. 03/05/21   Alene Mires, NP  Multiple Vitamin (MULTIVITAMIN WITH MINERALS) TABS tablet Take 1 tablet by mouth in the morning.  [provider]  Nirmatrelvir & Ritonavir (PAXLOVID) 20 x 150 MG & 10 x 100MG  TBPK Take 3 tablets by mouth in the morning and at bedtime.    [provider]  oxyCODONE (ROXICODONE) 5 MG immediate release tablet Take 1 tablet (5 mg total) by mouth every 6 (six) hours as needed for severe pain. 08/17/20   Milly Jakob, MD  tamsulosin (FLOMAX) 0.4 MG CAPS capsule Take  1 capsule (0.4 mg total) by mouth at bedtime. 03/05/21   Jacqualine Mau, NP     No family history on file.  Social History   Socioeconomic History   Marital status: Married    Spouse name: Not on file   Number of children: Not on file   Years of education: Not on file   Highest education level: Not on file  Occupational History   Not on file  Tobacco Use   Smoking status: Former    Types: Cigarettes    Quit date: 05/16/1975    Years since quitting: 45.8   Smokeless tobacco: Never  Vaping Use   Vaping Use: Never used  Substance and Sexual Activity   Alcohol use: Yes    Alcohol/week: 12.0 standard drinks    Types: 12 Glasses of wine per week   Drug use: Never   Sexual activity: Not on file  Other Topics Concern   Not on file  Social History Narrative   Not on file   Social Determinants of Health   Financial Resource Strain: Not on file  Food Insecurity: Not on file  Transportation Needs: Not on file  Physical Activity: Not on file  Stress: Not on file  Social Connections: Not on file   Review of Systems  Review of Systems: A 12 point ROS discussed and pertinent positives are indicated in the HPI above.  All other systems are negative.  Physical Exam No direct physical exam was performed (except for noted visual exam findings with Video Visits).    Vital Signs: There were no vitals taken for this visit.  Imaging:   Labs:  CBC: Recent Labs    03/05/21 0815  WBC 4.7  HGB 15.1  HCT 44.7  PLT 181    COAGS: Recent Labs    03/05/21 0815  INR 1.0    BMP: Recent Labs    03/01/21 0808 03/05/21 0815  NA  --  139  K  --  3.9  CL  --  105  CO2  --  25  GLUCOSE  --  124*  BUN  --  14  CALCIUM  --  8.9  CREATININE 0.70 0.56*  GFRNONAA  --  >60    LIVER FUNCTION TESTS: Recent Labs    03/05/21 0815  BILITOT 1.0  AST 18  ALT 18  ALKPHOS 65  PROT 6.8  ALBUMIN 3.8    TUMOR MARKERS: No results for input(s): AFPTM, CEA, CA199,  CHROMGRNA in the last 8760 hours.  Assessment and Plan:  Steve Andrews is doing well 3 weeks status post prostatic artery embolization.  His recovery was uneventful and uncomplicated.  He reports approximately 20% reduction in his lower urinary tract symptoms.  Overall, he is pleased and hopeful for continued benefit.  1.)  In person clinic visit in 3 months.  We will have him fill out a new International prostate symptom score questionnaire to evaluate his symptomatic progress.    Electronically Signed: Criselda Peaches 03/27/2021, 11:26 AM   I spent a total of  15 Minutes in remote  clinical consultation, greater than 50% of which was counseling/coordinating care for BPH with LUTS.    Visit type: Audio only (telephone). Audio (no video) only due to patient preference. Alternative for in-person consultation at [facility], [address] [place], [place], [place]. This visit type was conducted due to national recommendations for restrictions regarding the COVID-19 Pandemic (e.g. social distancing).  This format is felt to be most appropriate for this patient at this time.  All issues noted in this document were discussed and addressed.

## 2021-03-29 DIAGNOSIS — H353211 Exudative age-related macular degeneration, right eye, with active choroidal neovascularization: Secondary | ICD-10-CM | POA: Diagnosis not present

## 2021-04-25 DIAGNOSIS — S52502A Unspecified fracture of the lower end of left radius, initial encounter for closed fracture: Secondary | ICD-10-CM | POA: Diagnosis not present

## 2021-04-25 DIAGNOSIS — Z1389 Encounter for screening for other disorder: Secondary | ICD-10-CM | POA: Diagnosis not present

## 2021-04-25 DIAGNOSIS — N400 Enlarged prostate without lower urinary tract symptoms: Secondary | ICD-10-CM | POA: Diagnosis not present

## 2021-04-25 DIAGNOSIS — Z0000 Encounter for general adult medical examination without abnormal findings: Secondary | ICD-10-CM | POA: Diagnosis not present

## 2021-04-25 DIAGNOSIS — Z23 Encounter for immunization: Secondary | ICD-10-CM | POA: Diagnosis not present

## 2021-04-25 DIAGNOSIS — E559 Vitamin D deficiency, unspecified: Secondary | ICD-10-CM | POA: Diagnosis not present

## 2021-04-25 DIAGNOSIS — Z86010 Personal history of colonic polyps: Secondary | ICD-10-CM | POA: Diagnosis not present

## 2021-04-25 DIAGNOSIS — Z131 Encounter for screening for diabetes mellitus: Secondary | ICD-10-CM | POA: Diagnosis not present

## 2021-05-01 DIAGNOSIS — H35372 Puckering of macula, left eye: Secondary | ICD-10-CM | POA: Diagnosis not present

## 2021-05-01 DIAGNOSIS — H353211 Exudative age-related macular degeneration, right eye, with active choroidal neovascularization: Secondary | ICD-10-CM | POA: Diagnosis not present

## 2021-05-01 DIAGNOSIS — H43822 Vitreomacular adhesion, left eye: Secondary | ICD-10-CM | POA: Diagnosis not present

## 2021-05-31 DIAGNOSIS — H35372 Puckering of macula, left eye: Secondary | ICD-10-CM | POA: Diagnosis not present

## 2021-05-31 DIAGNOSIS — H353211 Exudative age-related macular degeneration, right eye, with active choroidal neovascularization: Secondary | ICD-10-CM | POA: Diagnosis not present

## 2021-05-31 DIAGNOSIS — H43822 Vitreomacular adhesion, left eye: Secondary | ICD-10-CM | POA: Diagnosis not present

## 2021-06-05 ENCOUNTER — Other Ambulatory Visit: Payer: Self-pay | Admitting: Interventional Radiology

## 2021-06-05 DIAGNOSIS — N400 Enlarged prostate without lower urinary tract symptoms: Secondary | ICD-10-CM

## 2021-06-18 ENCOUNTER — Other Ambulatory Visit: Payer: PPO

## 2021-06-20 ENCOUNTER — Encounter: Payer: Self-pay | Admitting: *Deleted

## 2021-06-20 ENCOUNTER — Ambulatory Visit
Admission: RE | Admit: 2021-06-20 | Discharge: 2021-06-20 | Disposition: A | Discharge status: Home / Self Care | Payer: PPO | Source: Ambulatory Visit | Attending: Interventional Radiology | Admitting: Interventional Radiology

## 2021-06-20 DIAGNOSIS — Z9889 Other specified postprocedural states: Secondary | ICD-10-CM | POA: Diagnosis not present

## 2021-06-20 DIAGNOSIS — N400 Enlarged prostate without lower urinary tract symptoms: Secondary | ICD-10-CM

## 2021-06-20 DIAGNOSIS — N401 Enlarged prostate with lower urinary tract symptoms: Secondary | ICD-10-CM | POA: Diagnosis not present

## 2021-06-20 HISTORY — PX: IR RADIOLOGIST EVAL & MGMT: IMG5224

## 2021-06-20 NOTE — Progress Notes (Signed)
? ? ?Chief Complaint: ?Patient was seen in consultation today for benign prostatic hyperplasia with severe lower urinary tract symptoms at the request of Steve Andrews ? ?Referring Physician(s): ?Steve Andrews ? ?History of Present Illness: ?Steve Andrews is a 81 y.o. male who presented in November of 2022 in his usual state of health to discuss minimally invasive treatment options for benign prostatic hyperplasia.  Steve Andrews is exceptionally healthy for his age.  He first went to the doctor for routine physical at age 61.  His primary medical issue is significant BPH with associated lower urinary tract symptoms and bladder stone formation.  He underwent cystoscopic surgical removal of his bladder stones with Dr. Janeece Andrews of [facility] in the past.  The procedure was successful, however he found the recovery somewhat challenging. ? ?At the time of presentation, his primary symptoms include incomplete emptying, intermittency, and extreme weakness.  I had him fill out the International prostate symptom score (IPSS) questionnaire at his first visit.  He scored a 21 out of 35.  This constituted severe symptoms.  His quality of life due to urinary symptoms was a 3 which is considered mixed.  Overall, he is extremely healthy and this is his only significant and nagging medical issue. ? ?He underwent prostatic artery embolization on 03/05/2021.  The procedure was successful and uncomplicated.  Today he visits me in the office for his 70-month follow-up evaluation.  His international prostate symptom score today is 4 out of 35 indicating mild symptoms.  Additionally, his quality of life due to urinary symptoms is a 1 indicating that he is pleased with his quality of life.  This represents a significant improvement compared to his initial presentation.  He tells me that he is extremely pleased with his results and would recommend this procedure to anyone. ? ?His only concern today is the fact that his wife  who has been long ill was recently admitted to the ICU and is not doing well. ? ?Past Medical History:  ?Diagnosis Date  ? Bladder stone   ? BPH (benign prostatic hyperplasia)   ? History of kidney stones   ? ? ?Past Surgical History:  ?Procedure Laterality Date  ? COLONOSCOPY W/ POLYPECTOMY    ? IR 3D INDEPENDENT WKST  03/05/2021  ? IR ANGIOGRAM PELVIS SELECTIVE OR SUPRASELECTIVE  03/05/2021  ? IR ANGIOGRAM PELVIS SELECTIVE OR SUPRASELECTIVE  03/05/2021  ? IR ANGIOGRAM SELECTIVE EACH ADDITIONAL VESSEL  03/05/2021  ? IR ANGIOGRAM SELECTIVE EACH ADDITIONAL VESSEL  03/05/2021  ? IR EMBO TUMOR ORGAN ISCHEMIA INFARCT INC GUIDE ROADMAPPING  03/05/2021  ? IR RADIOLOGIST EVAL & MGMT  01/22/2021  ? IR RADIOLOGIST EVAL & MGMT  03/27/2021  ? IR RADIOLOGIST EVAL & MGMT  06/20/2021  ? IR US GUIDE VASC ACCESS RIGHT  03/05/2021  ? lithrotripsy    ? OPEN REDUCTION INTERNAL FIXATION (ORIF) DISTAL RADIAL FRACTURE Right 08/17/2020  ? Procedure: OPEN TREATMENT OF DISPLACED RIGHT DISTAL RADIUS FRACTURE;  Surgeon: Mack Hook, MD;  Location: [facility] OR;  Service: Orthopedics;  Laterality: Right;  ? ? ?Allergies: ?Patient has no known allergies. ? ?Medications: ?Prior to Admission medications   ?Medication Sig Start Date End Date Taking? Authorizing Provider  ?acetaminophen (TYLENOL) 325 MG tablet Take 2 tablets (650 mg total) by mouth every 6 (six) hours. 08/17/20   Mack Hook, MD  ?cholecalciferol (VITAMIN D) 25 MCG (1000 UNIT) tablet Take 1,000 Units by mouth in the morning.    [provider]  ?DM-APAP-CPM Lucas Mallow  COLD & FLU NIGHT) 30-650-4 MG/30ML LIQD Take by mouth.    [provider]  ?ibuprofen (ADVIL) 200 MG tablet Take 3 tablets (600 mg total) by mouth every 6 (six) hours. 08/17/20   Milly Jakob, MD  ?methylPREDNISolone (MEDROL DOSEPAK) 4 MG TBPK tablet Taper dose. 03/05/21   Jacqualine Mau, NP  ?Multiple Vitamin (MULTIVITAMIN WITH MINERALS) TABS tablet Take 1 tablet by mouth in the morning.     [provider]  ?Nirmatrelvir & Ritonavir (PAXLOVID) 20 x 150 MG & 10 x 100MG  TBPK Take 3 tablets by mouth in the morning and at bedtime.    [provider]  ?oxyCODONE (ROXICODONE) 5 MG immediate release tablet Take 1 tablet (5 mg total) by mouth every 6 (six) hours as needed for severe pain. 08/17/20   Milly Jakob, MD  ?tamsulosin (FLOMAX) 0.4 MG CAPS capsule Take 1 capsule (0.4 mg total) by mouth at bedtime. 03/05/21   Jacqualine Mau, NP  ?  ? ?No family history on file. ? ?Social History  ? ?Socioeconomic History  ? Marital status: Married  ?  Spouse name: Not on file  ? Number of children: Not on file  ? Years of education: Not on file  ? Highest education level: Not on file  ?Occupational History  ? Not on file  ?Tobacco Use  ? Smoking status: Former  ?  Types: Cigarettes  ?  Quit date: 05/16/1975  ?  Years since quitting: 46.1  ? Smokeless tobacco: Never  ?Vaping Use  ? Vaping Use: Never used  ?Substance and Sexual Activity  ? Alcohol use: Yes  ?  Alcohol/week: 12.0 standard drinks  ?  Types: 12 Glasses of wine per week  ? Drug use: Never  ? Sexual activity: Not on file  ?Other Topics Concern  ? Not on file  ?Social History Narrative  ? Not on file  ? ?Social Determinants of Health  ? ?Financial Resource Strain: Not on file  ?Food Insecurity: Not on file  ?Transportation Needs: Not on file  ?Physical Activity: Not on file  ?Stress: Not on file  ?Social Connections: Not on file  ? ? ?Review of Systems: A 12 point ROS discussed and pertinent positives are indicated in the HPI above.  All other systems are negative. ? ?Review of Systems ? ?Vital Signs: ?BP 131/68 (BP Location: Left Arm)   Pulse 78   SpO2 97%  ? ?Physical Exam ?Constitutional:   ?   Appearance: Normal appearance.  ?HENT:  ?   Head: Normocephalic and atraumatic.  ?Eyes:  ?   General: No scleral icterus. ?Cardiovascular:  ?   Rate and Rhythm: Normal rate.  ?Pulmonary:  ?   Effort: Pulmonary effort is normal.  ?Abdominal:   ?   General: Abdomen is flat.  ?   Palpations: Abdomen is soft.  ?Skin: ?   General: Skin is warm and dry.  ?Neurological:  ?   General: No focal deficit present.  ?   Mental Status: He is alert and oriented to person, place, and time.  ?Psychiatric:     ?   Mood and Affect: Mood normal.     ?   Behavior: Behavior normal.  ? ? ? ?Imaging: ?IR Radiologist Eval & Mgmt ? ?Result Date: 06/20/2021 ?Please refer to notes tab for details about interventional procedure. (Op Note)  ? ?Labs: ? ?CBC: ?Recent Labs  ?  03/05/21 ?0815  ?WBC 4.7  ?HGB 15.1  ?HCT 44.7  ?PLT 181  ? ? ?  COAGS: ?Recent Labs  ?  03/05/21 ?0815  ?INR 1.0  ? ? ?BMP: ?Recent Labs  ?  03/01/21 ?0808 03/05/21 ?0815  ?NA  --  139  ?Andrews  --  3.9  ?CL  --  105  ?CO2  --  25  ?GLUCOSE  --  124*  ?BUN  --  14  ?CALCIUM  --  8.9  ?CREATININE 0.70 0.56*  ?GFRNONAA  --  >60  ? ? ?LIVER FUNCTION TESTS: ?Recent Labs  ?  03/05/21 ?0815  ?BILITOT 1.0  ?AST 18  ?ALT 18  ?ALKPHOS 65  ?PROT 6.8  ?ALBUMIN 3.8  ? ? ?TUMOR MARKERS: ?No results for input(s): AFPTM, CEA, CA199, CHROMGRNA in the last 8760 hours. ? ?Assessment and Plan: ? ?Exceptionally pleasant 81 year old male with a history of benign prostatic hyperplasia and severe lower urinary tract symptoms.  His symptoms are now significantly improved 3 months status post bilateral prostatic artery embolization.  He is extremely happy with his result.  He would like to continue taking Flomax for now.  He wants to have his prescription available.  He may try going without at some point in the near future.  I told him I am happy to give him a new prescription. ? ?1.) Flomax 0.4 mg daily at bedtime  ?2.) F/U clinic visit in 3 months (early July) ? ? ? ?Electronically Signed: ?Antonietta Jewel Nyrah Demos ?06/20/2021, 10:30 AM ? ? ?I spent a total of  15 Minutes in face to face in clinical consultation, greater than 50% of which was counseling/coordinating care for benign prostatic hyperplasia.  ? ?

## 2021-06-28 DIAGNOSIS — H43822 Vitreomacular adhesion, left eye: Secondary | ICD-10-CM | POA: Diagnosis not present

## 2021-06-28 DIAGNOSIS — H35372 Puckering of macula, left eye: Secondary | ICD-10-CM | POA: Diagnosis not present

## 2021-06-28 DIAGNOSIS — H353211 Exudative age-related macular degeneration, right eye, with active choroidal neovascularization: Secondary | ICD-10-CM | POA: Diagnosis not present

## 2021-08-29 ENCOUNTER — Other Ambulatory Visit: Payer: Self-pay | Admitting: Interventional Radiology

## 2021-08-29 DIAGNOSIS — N400 Enlarged prostate without lower urinary tract symptoms: Secondary | ICD-10-CM

## 2021-09-24 ENCOUNTER — Encounter: Payer: Self-pay | Admitting: *Deleted

## 2021-09-24 ENCOUNTER — Ambulatory Visit
Admission: RE | Admit: 2021-09-24 | Discharge: 2021-09-24 | Disposition: A | Discharge status: Home / Self Care | Payer: HMO | Source: Ambulatory Visit | Attending: Interventional Radiology | Admitting: Interventional Radiology

## 2021-09-24 DIAGNOSIS — N400 Enlarged prostate without lower urinary tract symptoms: Secondary | ICD-10-CM

## 2021-09-24 DIAGNOSIS — N401 Enlarged prostate with lower urinary tract symptoms: Secondary | ICD-10-CM | POA: Diagnosis not present

## 2021-09-24 DIAGNOSIS — Z9889 Other specified postprocedural states: Secondary | ICD-10-CM | POA: Diagnosis not present

## 2021-09-24 HISTORY — PX: IR RADIOLOGIST EVAL & MGMT: IMG5224

## 2021-09-24 NOTE — Progress Notes (Signed)
Chief Complaint: Patient was consulted remotely today (TeleHealth) for BPH with LUTS at the request of Xaiden Fleig K.    Referring Physician(s): Annemarie Sebree K  History of Present Illness: Steve Andrews is a 81 y.o. male who presented in November of 2022 in his usual state of health to discuss minimally invasive treatment options for benign prostatic hyperplasia.  Steve Andrews is exceptionally healthy for his age.  He first went to the doctor for routine physical at age 71.  His primary medical issue is significant BPH with associated lower urinary tract symptoms and bladder stone formation.  He underwent cystoscopic surgical removal of his bladder stones with Dr. Janeece Agee of [facility] in the past.  The procedure was successful, however he found the recovery somewhat challenging.  At the time of presentation, his primary symptoms include incomplete emptying, intermittency, and extreme weakness.  I had him fill out the International prostate symptom score (IPSS) questionnaire at his first visit.  He scored a 21 out of 35.  This constituted severe symptoms.  His quality of life due to urinary symptoms was a 3 which is considered mixed.  Overall, he is extremely healthy and this is his only significant and nagging medical issue.  He underwent prostatic artery embolization on 03/05/2021.  The procedure was successful and uncomplicated.    We spoke over the phone today for 62-month follow-up evaluation.  Steve Andrews reports that the excellent results he was experiencing in 3 months remain the same.  He has had no recurrent symptoms.  He tells me that he is extremely pleased with his results and would recommend this procedure to anyone.   Past Medical History:  Diagnosis Date   Bladder stone    BPH (benign prostatic hyperplasia)    History of kidney stones     Past Surgical History:  Procedure Laterality Date   COLONOSCOPY W/ POLYPECTOMY     IR 3D INDEPENDENT WKST   03/05/2021   IR ANGIOGRAM PELVIS SELECTIVE OR SUPRASELECTIVE  03/05/2021   IR ANGIOGRAM PELVIS SELECTIVE OR SUPRASELECTIVE  03/05/2021   IR ANGIOGRAM SELECTIVE EACH ADDITIONAL VESSEL  03/05/2021   IR ANGIOGRAM SELECTIVE EACH ADDITIONAL VESSEL  03/05/2021   IR EMBO TUMOR ORGAN ISCHEMIA INFARCT INC GUIDE ROADMAPPING  03/05/2021   IR RADIOLOGIST EVAL & MGMT  01/22/2021   IR RADIOLOGIST EVAL & MGMT  03/27/2021   IR RADIOLOGIST EVAL & MGMT  06/20/2021   IR US GUIDE VASC ACCESS RIGHT  03/05/2021   lithrotripsy     OPEN REDUCTION INTERNAL FIXATION (ORIF) DISTAL RADIAL FRACTURE Right 08/17/2020   Procedure: OPEN TREATMENT OF DISPLACED RIGHT DISTAL RADIUS FRACTURE;  Surgeon: Mack Hook, MD;  Location: [facility] OR;  Service: Orthopedics;  Laterality: Right;    Allergies: Patient has no known allergies.  Medications: Prior to Admission medications   Medication Sig Start Date End Date Taking? Authorizing Provider  acetaminophen (TYLENOL) 325 MG tablet Take 2 tablets (650 mg total) by mouth every 6 (six) hours. 08/17/20   Mack Hook, MD  cholecalciferol (VITAMIN D) 25 MCG (1000 UNIT) tablet Take 1,000 Units by mouth in the morning.    [provider]  DM-APAP-CPM (VICKS NYQUIL COLD & FLU NIGHT) 30-650-4 MG/30ML LIQD Take by mouth.    [provider]  ibuprofen (ADVIL) 200 MG tablet Take 3 tablets (600 mg total) by mouth every 6 (six) hours. 08/17/20   Mack Hook, MD  methylPREDNISolone (MEDROL DOSEPAK) 4 MG TBPK tablet Taper dose. 03/05/21   Omohundro,  Darden Dates, NP  Multiple Vitamin (MULTIVITAMIN WITH MINERALS) TABS tablet Take 1 tablet by mouth in the morning.    [provider]  Nirmatrelvir & Ritonavir (PAXLOVID) 20 x 150 MG & 10 x 100MG  TBPK Take 3 tablets by mouth in the morning and at bedtime.    [provider]  oxyCODONE (ROXICODONE) 5 MG immediate release tablet Take 1 tablet (5 mg total) by mouth every 6 (six) hours as needed for severe pain. 08/17/20    10/17/20, MD  tamsulosin (FLOMAX) 0.4 MG CAPS capsule Take 1 capsule (0.4 mg total) by mouth at bedtime. 03/05/21   03/07/21, NP     No family history on file.  Social History   Socioeconomic History   Marital status: Married    Spouse name: Not on file   Number of children: Not on file   Years of education: Not on file   Highest education level: Not on file  Occupational History   Not on file  Tobacco Use   Smoking status: Former    Types: Cigarettes    Quit date: 05/16/1975    Years since quitting: 46.3   Smokeless tobacco: Never  Vaping Use   Vaping Use: Never used  Substance and Sexual Activity   Alcohol use: Yes    Alcohol/week: 12.0 standard drinks of alcohol    Types: 12 Glasses of wine per week   Drug use: Never   Sexual activity: Not on file  Other Topics Concern   Not on file  Social History Narrative   Not on file   Social Determinants of Health   Financial Resource Strain: Not on file  Food Insecurity: Not on file  Transportation Needs: Not on file  Physical Activity: Not on file  Stress: Not on file  Social Connections: Not on file    Review of Systems  Review of Systems: A 12 point ROS discussed and pertinent positives are indicated in the HPI above.  All other systems are negative.    Physical Exam No direct physical exam was performed (except for noted visual exam findings with Video Visits).    Vital Signs: There were no vitals taken for this visit.  Imaging: No results found.  Labs:  CBC: Recent Labs    03/05/21 0815  WBC 4.7  HGB 15.1  HCT 44.7  PLT 181    COAGS: Recent Labs    03/05/21 0815  INR 1.0    BMP: Recent Labs    03/01/21 0808 03/05/21 0815  NA  --  139  K  --  3.9  CL  --  105  CO2  --  25  GLUCOSE  --  124*  BUN  --  14  CALCIUM  --  8.9  CREATININE 0.70 0.56*  GFRNONAA  --  >60    LIVER FUNCTION TESTS: Recent Labs    03/05/21 0815  BILITOT 1.0  AST 18  ALT 18   ALKPHOS 65  PROT 6.8  ALBUMIN 3.8    TUMOR MARKERS: No results for input(s): "AFPTM", "CEA", "CA199", "CHROMGRNA" in the last 8760 hours.  Assessment and Plan:  Exceptionally pleasant 81 year old male with a history of benign prostatic hyperplasia and severe lower urinary tract symptoms.  His symptoms remain significantly improved 6 months status post bilateral prostatic artery embolization.  He is extremely happy with his result.  He would like to continue taking Flomax for now.  He may try going without at some point in the  near future.     1.)  No further scheduled follow-up.  Steve Andrews will reach out to [place] in the future if he ever develops recurrent symptoms.     Electronically Signed: Sterling Big 09/24/2021, 11:28 AM   I spent a total of  15 Minutes in remote  clinical consultation, greater than 50% of which was counseling/coordinating care for BPH with LUTS.    Visit type: Audio only (telephone). Audio (no video) only due to patient preference. Alternative for in-person consultation at [facility], [address] [place], [place], [place]. This visit type was conducted due to national recommendations for restrictions regarding the COVID-19 Pandemic (e.g. social distancing).  This format is felt to be most appropriate for this patient at this time.  All issues noted in this document were discussed and addressed.

## 2021-10-02 DIAGNOSIS — H353213 Exudative age-related macular degeneration, right eye, with inactive scar: Secondary | ICD-10-CM | POA: Diagnosis not present

## 2021-10-02 DIAGNOSIS — H35372 Puckering of macula, left eye: Secondary | ICD-10-CM | POA: Diagnosis not present

## 2021-10-02 DIAGNOSIS — H353221 Exudative age-related macular degeneration, left eye, with active choroidal neovascularization: Secondary | ICD-10-CM | POA: Diagnosis not present

## 2021-10-02 DIAGNOSIS — H43822 Vitreomacular adhesion, left eye: Secondary | ICD-10-CM | POA: Diagnosis not present

## 2021-10-16 DIAGNOSIS — H353221 Exudative age-related macular degeneration, left eye, with active choroidal neovascularization: Secondary | ICD-10-CM | POA: Diagnosis not present

## 2021-11-13 DIAGNOSIS — H353221 Exudative age-related macular degeneration, left eye, with active choroidal neovascularization: Secondary | ICD-10-CM | POA: Diagnosis not present

## 2021-11-13 DIAGNOSIS — H353213 Exudative age-related macular degeneration, right eye, with inactive scar: Secondary | ICD-10-CM | POA: Diagnosis not present

## 2021-11-13 DIAGNOSIS — H35372 Puckering of macula, left eye: Secondary | ICD-10-CM | POA: Diagnosis not present

## 2021-11-13 DIAGNOSIS — H43822 Vitreomacular adhesion, left eye: Secondary | ICD-10-CM | POA: Diagnosis not present

## 2021-12-06 DIAGNOSIS — Z23 Encounter for immunization: Secondary | ICD-10-CM | POA: Diagnosis not present

## 2021-12-11 DIAGNOSIS — H353211 Exudative age-related macular degeneration, right eye, with active choroidal neovascularization: Secondary | ICD-10-CM | POA: Diagnosis not present

## 2021-12-11 DIAGNOSIS — H353221 Exudative age-related macular degeneration, left eye, with active choroidal neovascularization: Secondary | ICD-10-CM | POA: Diagnosis not present

## 2021-12-11 DIAGNOSIS — H35372 Puckering of macula, left eye: Secondary | ICD-10-CM | POA: Diagnosis not present

## 2021-12-11 DIAGNOSIS — H43822 Vitreomacular adhesion, left eye: Secondary | ICD-10-CM | POA: Diagnosis not present

## 2022-01-22 DIAGNOSIS — H35372 Puckering of macula, left eye: Secondary | ICD-10-CM | POA: Diagnosis not present

## 2022-01-22 DIAGNOSIS — H353211 Exudative age-related macular degeneration, right eye, with active choroidal neovascularization: Secondary | ICD-10-CM | POA: Diagnosis not present

## 2022-01-22 DIAGNOSIS — H43822 Vitreomacular adhesion, left eye: Secondary | ICD-10-CM | POA: Diagnosis not present

## 2022-01-22 DIAGNOSIS — H353221 Exudative age-related macular degeneration, left eye, with active choroidal neovascularization: Secondary | ICD-10-CM | POA: Diagnosis not present

## 2022-01-28 ENCOUNTER — Other Ambulatory Visit: Payer: Self-pay | Admitting: Interventional Radiology

## 2022-01-28 DIAGNOSIS — N400 Enlarged prostate without lower urinary tract symptoms: Secondary | ICD-10-CM

## 2022-01-30 ENCOUNTER — Other Ambulatory Visit: Payer: Self-pay | Admitting: Interventional Radiology

## 2022-01-30 ENCOUNTER — Encounter: Payer: Self-pay | Admitting: Lab

## 2022-01-30 ENCOUNTER — Ambulatory Visit
Admission: RE | Admit: 2022-01-30 | Discharge: 2022-01-30 | Disposition: A | Discharge status: Home / Self Care | Payer: PPO | Source: Ambulatory Visit | Attending: Interventional Radiology | Admitting: Interventional Radiology

## 2022-01-30 DIAGNOSIS — N401 Enlarged prostate with lower urinary tract symptoms: Secondary | ICD-10-CM | POA: Diagnosis not present

## 2022-01-30 DIAGNOSIS — N400 Enlarged prostate without lower urinary tract symptoms: Secondary | ICD-10-CM

## 2022-01-30 DIAGNOSIS — Z9889 Other specified postprocedural states: Secondary | ICD-10-CM | POA: Diagnosis not present

## 2022-01-30 HISTORY — PX: IR RADIOLOGIST EVAL & MGMT: IMG5224

## 2022-01-30 NOTE — Progress Notes (Signed)
Chief Complaint: Patient was seen in consultation today for BPH with LUTS  at the request of Bellarae Lizer K  Referring Physician(s): Cordon Gassett K  History of Present Illness: Steve Andrews is a 81 y.o. male who presented in November of 2022 in his usual state of health to discuss minimally invasive treatment options for benign prostatic hyperplasia.  Steve Andrews is exceptionally healthy for his age.  He first went to the doctor for routine physical at age 40.  His primary medical issue is significant BPH with associated lower urinary tract symptoms and bladder stone formation.  He underwent cystoscopic surgical removal of his bladder stones with Dr. Janeece Agee of [facility] in the past.  The procedure was successful, however he found the recovery somewhat challenging.  At the time of presentation, his primary symptoms include incomplete emptying, intermittency, and extreme weakness.  I had him fill out the International prostate symptom score (IPSS) questionnaire at his first visit.  He scored a 21 out of 35.  This constituted severe symptoms.  His quality of life due to urinary symptoms was a 3 which is considered mixed.  Overall, he is extremely healthy and this is his only significant and nagging medical issue.  He underwent prostatic artery embolization on 03/05/2021.  The procedure was successful and uncomplicated.     At 3 and 85-month follow-up, he was doing very well with excellent clinical results.  He comes into the office today unfortunately with recurrent symptoms at 11 months post embolization.  His symptoms are more intermittent than they were previously but he recognizes that he is getting up more often at night to urinate.  Additionally, he is having some issues with intermittent hematuria which only happens after he goes for a jog or hiking.  He said the last time this happened to him he had bladder stones and he feels quite confident that he must have a  recurrent bladder stone.  Unfortunately he also reports that his wife is very ill and is dying from cancer.  She may have a year or so left.  He wants to be proactive and get ahead of his symptoms with as little downtime as possible so that he can be available to continue to care for her.  Past Medical History:  Diagnosis Date   Bladder stone    BPH (benign prostatic hyperplasia)    History of kidney stones     Past Surgical History:  Procedure Laterality Date   COLONOSCOPY W/ POLYPECTOMY     IR 3D INDEPENDENT WKST  03/05/2021   IR ANGIOGRAM PELVIS SELECTIVE OR SUPRASELECTIVE  03/05/2021   IR ANGIOGRAM PELVIS SELECTIVE OR SUPRASELECTIVE  03/05/2021   IR ANGIOGRAM SELECTIVE EACH ADDITIONAL VESSEL  03/05/2021   IR ANGIOGRAM SELECTIVE EACH ADDITIONAL VESSEL  03/05/2021   IR EMBO TUMOR ORGAN ISCHEMIA INFARCT INC GUIDE ROADMAPPING  03/05/2021   IR RADIOLOGIST EVAL & MGMT  01/22/2021   IR RADIOLOGIST EVAL & MGMT  03/27/2021   IR RADIOLOGIST EVAL & MGMT  06/20/2021   IR RADIOLOGIST EVAL & MGMT  09/24/2021   IR US GUIDE VASC ACCESS RIGHT  03/05/2021   lithrotripsy     OPEN REDUCTION INTERNAL FIXATION (ORIF) DISTAL RADIAL FRACTURE Right 08/17/2020   Procedure: OPEN TREATMENT OF DISPLACED RIGHT DISTAL RADIUS FRACTURE;  Surgeon: Mack Hook, MD;  Location: [facility] OR;  Service: Orthopedics;  Laterality: Right;    Allergies: Patient has no known allergies.  Medications: Prior to Admission medications   Medication Sig  Start Date End Date Taking? Authorizing Provider  acetaminophen (TYLENOL) 325 MG tablet Take 2 tablets (650 mg total) by mouth every 6 (six) hours. 08/17/20   Mack Hook, MD  cholecalciferol (VITAMIN D) 25 MCG (1000 UNIT) tablet Take 1,000 Units by mouth in the morning.    [provider]  DM-APAP-CPM (VICKS NYQUIL COLD & FLU NIGHT) 30-650-4 MG/30ML LIQD Take by mouth.    [provider]  ibuprofen (ADVIL) 200 MG tablet Take 3 tablets (600 mg total) by mouth every  6 (six) hours. 08/17/20   Mack Hook, MD  methylPREDNISolone (MEDROL DOSEPAK) 4 MG TBPK tablet Taper dose. 03/05/21   Alene Mires, NP  Multiple Vitamin (MULTIVITAMIN WITH MINERALS) TABS tablet Take 1 tablet by mouth in the morning.    [provider]  Nirmatrelvir & Ritonavir (PAXLOVID) 20 x 150 MG & 10 x 100MG  TBPK Take 3 tablets by mouth in the morning and at bedtime.    [provider]  oxyCODONE (ROXICODONE) 5 MG immediate release tablet Take 1 tablet (5 mg total) by mouth every 6 (six) hours as needed for severe pain. 08/17/20   10/17/20, MD  tamsulosin (FLOMAX) 0.4 MG CAPS capsule Take 1 capsule (0.4 mg total) by mouth at bedtime. 03/05/21   03/07/21, NP     No family history on file.  Social History   Socioeconomic History   Marital status: Married    Spouse name: Not on file   Number of children: Not on file   Years of education: Not on file   Highest education level: Not on file  Occupational History   Not on file  Tobacco Use   Smoking status: Former    Types: Cigarettes    Quit date: 05/16/1975    Years since quitting: 46.7   Smokeless tobacco: Never  Vaping Use   Vaping Use: Never used  Substance and Sexual Activity   Alcohol use: Yes    Alcohol/week: 12.0 standard drinks of alcohol    Types: 12 Glasses of wine per week   Drug use: Never   Sexual activity: Not on file  Other Topics Concern   Not on file  Social History Narrative   Not on file   Social Determinants of Health   Financial Resource Strain: Not on file  Food Insecurity: Not on file  Transportation Needs: Not on file  Physical Activity: Not on file  Stress: Not on file  Social Connections: Not on file   Review of Systems: A 12 point ROS discussed and pertinent positives are indicated in the HPI above.  All other systems are negative.  Review of Systems  Vital Signs: BP (!) 142/72 (BP Location: Right Arm)   Pulse 80   SpO2 97%    Physical  Exam Constitutional:      General: He is not in acute distress.    Appearance: Normal appearance.  HENT:     Head: Normocephalic and atraumatic.  Eyes:     General: No scleral icterus. Cardiovascular:     Rate and Rhythm: Normal rate.  Pulmonary:     Effort: Pulmonary effort is normal.  Abdominal:     General: Abdomen is flat. There is no distension.  Skin:    General: Skin is warm and dry.  Neurological:     General: No focal deficit present.     Mental Status: He is alert and oriented to person, place, and time.  Psychiatric:  Mood and Affect: Mood normal.        Behavior: Behavior normal.       Imaging: No results found.  Labs:  CBC: Recent Labs    03/05/21 0815  WBC 4.7  HGB 15.1  HCT 44.7  PLT 181    COAGS: Recent Labs    03/05/21 0815  INR 1.0    BMP: Recent Labs    03/01/21 0808 03/05/21 0815  NA  --  139  K  --  3.9  CL  --  105  CO2  --  25  GLUCOSE  --  124*  BUN  --  14  CALCIUM  --  8.9  CREATININE 0.70 0.56*  GFRNONAA  --  >60    LIVER FUNCTION TESTS: Recent Labs    03/05/21 0815  BILITOT 1.0  AST 18  ALT 18  ALKPHOS 65  PROT 6.8  ALBUMIN 3.8    TUMOR MARKERS: No results for input(s): "AFPTM", "CEA", "CA199", "CHROMGRNA" in the last 8760 hours.  Assessment and Plan:  Pleasant 81 year old gentleman with a history of benign prostatic hyperplasia now almost 1 year status post prostatic artery embolization.  Unfortunately, he is developing recurrent lower urinary tract symptoms.  He is strongly interested in undergoing repeat prostatic artery embolization.  He would like to avoid surgery if at all possible.  His wife is very ill and dying from cancer and he feels strongly that he needs to be available to care for her and cannot tolerate downtime postsurgery.  1. ) CT arteriogram of the pelvis to be performed at [facility] for procedural planning.  2.)  Schedule for repeat prostate artery embolization also  to be performed at [facility] as soon as possible.  The procedure must be after the CT scan but we do not need to wait for the CT scan results to schedule.   Electronically Signed: Sterling Big 01/30/2022, 11:17 AM   I spent a total of  15 Minutes in face to face in clinical consultation, greater than 50% of which was counseling/coordinating care for  BPH with recurrent LUTS.

## 2022-01-31 ENCOUNTER — Other Ambulatory Visit (HOSPITAL_COMMUNITY): Payer: Self-pay | Admitting: Interventional Radiology

## 2022-01-31 ENCOUNTER — Ambulatory Visit (HOSPITAL_COMMUNITY)
Admission: RE | Admit: 2022-01-31 | Discharge: 2022-01-31 | Disposition: A | Discharge status: Home / Self Care | Payer: PPO | Source: Ambulatory Visit | Attending: Interventional Radiology | Admitting: Interventional Radiology

## 2022-01-31 DIAGNOSIS — N401 Enlarged prostate with lower urinary tract symptoms: Secondary | ICD-10-CM | POA: Diagnosis not present

## 2022-01-31 DIAGNOSIS — N210 Calculus in bladder: Secondary | ICD-10-CM | POA: Diagnosis not present

## 2022-01-31 DIAGNOSIS — N400 Enlarged prostate without lower urinary tract symptoms: Secondary | ICD-10-CM | POA: Insufficient documentation

## 2022-01-31 LAB — POCT I-STAT CREATININE: Creatinine, Ser: 0.7 mg/dL (ref 0.61–1.24)

## 2022-01-31 MED ORDER — IOHEXOL 350 MG/ML SOLN
100.0000 mL | Freq: Once | INTRAVENOUS | Status: AC | PRN
  Administered 2022-01-31: 100 mL via INTRAVENOUS

## 2022-02-26 ENCOUNTER — Other Ambulatory Visit: Payer: Self-pay | Admitting: Radiology

## 2022-02-26 DIAGNOSIS — N400 Enlarged prostate without lower urinary tract symptoms: Secondary | ICD-10-CM

## 2022-02-27 ENCOUNTER — Other Ambulatory Visit (HOSPITAL_COMMUNITY): Payer: Self-pay | Admitting: Interventional Radiology

## 2022-02-27 ENCOUNTER — Ambulatory Visit (HOSPITAL_COMMUNITY)
Admission: RE | Admit: 2022-02-27 | Discharge: 2022-02-27 | Disposition: A | Discharge status: Home / Self Care | Payer: PPO | Source: Ambulatory Visit | Attending: Interventional Radiology | Admitting: Interventional Radiology

## 2022-02-27 ENCOUNTER — Encounter (HOSPITAL_COMMUNITY): Payer: Self-pay

## 2022-02-27 DIAGNOSIS — N400 Enlarged prostate without lower urinary tract symptoms: Secondary | ICD-10-CM

## 2022-02-27 DIAGNOSIS — N401 Enlarged prostate with lower urinary tract symptoms: Secondary | ICD-10-CM | POA: Diagnosis not present

## 2022-02-27 HISTORY — PX: IR ANGIOGRAM PELVIS SELECTIVE OR SUPRASELECTIVE: IMG661

## 2022-02-27 HISTORY — PX: IR US GUIDE VASC ACCESS RIGHT: IMG2390

## 2022-02-27 HISTORY — PX: IR EMBO TUMOR ORGAN ISCHEMIA INFARCT INC GUIDE ROADMAPPING: IMG5449

## 2022-02-27 HISTORY — PX: IR ANGIOGRAM SELECTIVE EACH ADDITIONAL VESSEL: IMG667

## 2022-02-27 LAB — COMPREHENSIVE METABOLIC PANEL
ALT: 30 U/L (ref 0–44)
AST: 26 U/L (ref 15–41)
Albumin: 3.5 g/dL (ref 3.5–5.0)
Alkaline Phosphatase: 75 U/L (ref 38–126)
Anion gap: 6 (ref 5–15)
BUN: 15 mg/dL (ref 8–23)
CO2: 27 mmol/L (ref 22–32)
Calcium: 9.1 mg/dL (ref 8.9–10.3)
Chloride: 106 mmol/L (ref 98–111)
Creatinine, Ser: 0.65 mg/dL (ref 0.61–1.24)
GFR, Estimated: >60 mL/min (ref >60)
Glucose, Bld: 131 mg/dL — ABNORMAL HIGH (ref 70–99)
Potassium: 4 mmol/L (ref 3.5–5.1)
Sodium: 139 mmol/L (ref 135–145)
Total Bilirubin: 0.7 mg/dL (ref 0.3–1.2)
Total Protein: 6.8 g/dL (ref 6.5–8.1)

## 2022-02-27 LAB — CBC WITH DIFFERENTIAL/PLATELET
Abs Immature Granulocytes: 0.02 10*3/uL (ref 0.00–0.07)
Basophils Absolute: 0 10*3/uL (ref 0.0–0.1)
Basophils Relative: 0 %
Eosinophils Absolute: 0.2 10*3/uL (ref 0.0–0.5)
Eosinophils Relative: 4 %
HCT: 42.5 % (ref 39.0–52.0)
Hemoglobin: 14.2 g/dL (ref 13.0–17.0)
Immature Granulocytes: 0 %
Lymphocytes Relative: 25 %
Lymphs Abs: 1.3 10*3/uL (ref 0.7–4.0)
MCH: 30.8 pg (ref 26.0–34.0)
MCHC: 33.4 g/dL (ref 30.0–36.0)
MCV: 92.2 fL (ref 80.0–100.0)
Monocytes Absolute: 0.5 10*3/uL (ref 0.1–1.0)
Monocytes Relative: 9 %
Neutro Abs: 3.2 10*3/uL (ref 1.7–7.7)
Neutrophils Relative %: 62 %
Platelets: 144 10*3/uL — ABNORMAL LOW (ref 150–400)
RBC: 4.61 MIL/uL (ref 4.22–5.81)
RDW: 13.1 % (ref 11.5–15.5)
WBC: 5.1 10*3/uL (ref 4.0–10.5)
nRBC: 0 % (ref 0.0–0.2)

## 2022-02-27 LAB — PROTIME-INR
INR: 1 (ref 0.8–1.2)
Prothrombin Time: 13.1 seconds (ref 11.4–15.2)

## 2022-02-27 MED ORDER — IOHEXOL 300 MG/ML  SOLN
100.0000 mL | Freq: Once | INTRAMUSCULAR | Status: AC | PRN
  Administered 2022-02-27: 20 mL via INTRA_ARTERIAL

## 2022-02-27 MED ORDER — HEPARIN SODIUM (PORCINE) 1000 UNIT/ML IJ SOLN: INTRAMUSCULAR | Status: AC | PRN

## 2022-02-27 MED ORDER — OXYCODONE-ACETAMINOPHEN 7.5-325 MG PO TABS: 1.0000 | ORAL_TABLET | Freq: Four times a day (QID) | ORAL | 0 refills | Status: AC | PRN

## 2022-02-27 MED ORDER — SULFAMETHOXAZOLE-TRIMETHOPRIM 400-80 MG PO TABS
1.0000 | ORAL_TABLET | Freq: Once | ORAL | Status: AC
  Administered 2022-02-27: 1 via ORAL
  Filled 2022-02-27: qty 1

## 2022-02-27 MED ORDER — MIDAZOLAM HCL 2 MG/2ML IJ SOLN
INTRAMUSCULAR | Status: AC
  Filled 2022-02-27: qty 4

## 2022-02-27 MED ORDER — FENTANYL CITRATE (PF) 100 MCG/2ML IJ SOLN
INTRAMUSCULAR | Status: AC | PRN
  Administered 2022-02-27 (×2): 50 ug via INTRAVENOUS

## 2022-02-27 MED ORDER — MIDAZOLAM HCL 2 MG/2ML IJ SOLN
INTRAMUSCULAR | Status: AC | PRN
  Administered 2022-02-27 (×4): 1 mg via INTRAVENOUS

## 2022-02-27 MED ORDER — SULFAMETHOXAZOLE-TRIMETHOPRIM 400-80 MG PO TABS: 1.0000 | ORAL_TABLET | Freq: Two times a day (BID) | ORAL | 0 refills | Status: AC

## 2022-02-27 MED ORDER — LIDOCAINE-EPINEPHRINE 1 %-1:100000 IJ SOLN
INTRAMUSCULAR | Status: AC | PRN
  Administered 2022-02-27: 10 mL

## 2022-02-27 MED ORDER — SODIUM CHLORIDE 0.9 % IV SOLN: INTRAVENOUS | Status: DC

## 2022-02-27 MED ORDER — NITROGLYCERIN 1 MG/10 ML FOR IR/CATH LAB: INTRA_ARTERIAL | Status: AC | PRN

## 2022-02-27 MED ORDER — DEXAMETHASONE SODIUM PHOSPHATE 10 MG/ML IJ SOLN
INTRAMUSCULAR | Status: AC
  Administered 2022-02-27: 8 mg via INTRAVENOUS
  Filled 2022-02-27: qty 1

## 2022-02-27 MED ORDER — LIDOCAINE HCL 1 % IJ SOLN
INTRAMUSCULAR | Status: AC
  Filled 2022-02-27: qty 20

## 2022-02-27 MED ORDER — NAPROXEN 500 MG PO TABS: 500.0000 mg | ORAL_TABLET | Freq: Two times a day (BID) | ORAL | 0 refills | Status: AC

## 2022-02-27 MED ORDER — NITROGLYCERIN IN D5W 100-5 MCG/ML-% IV SOLN
INTRAVENOUS | Status: AC
  Filled 2022-02-27: qty 250

## 2022-02-27 MED ORDER — FENTANYL CITRATE (PF) 100 MCG/2ML IJ SOLN
INTRAMUSCULAR | Status: AC
  Filled 2022-02-27: qty 2

## 2022-02-27 MED ORDER — NITROGLYCERIN 1 MG/10 ML FOR IR/CATH LAB
INTRA_ARTERIAL | Status: AC | PRN
  Administered 2022-02-27: 2 mL via INTRA_ARTERIAL

## 2022-02-27 MED ORDER — NAPROXEN 250 MG PO TABS
500.0000 mg | ORAL_TABLET | Freq: Once | ORAL | Status: AC
  Administered 2022-02-27: 500 mg via ORAL
  Filled 2022-02-27: qty 2

## 2022-02-27 MED ORDER — IOHEXOL 300 MG/ML  SOLN
100.0000 mL | Freq: Once | INTRAMUSCULAR | Status: AC | PRN
  Administered 2022-02-27: 30 mL via INTRA_ARTERIAL

## 2022-02-27 MED ORDER — DEXAMETHASONE SODIUM PHOSPHATE 10 MG/ML IJ SOLN: 8.0000 mg | Freq: Once | INTRAMUSCULAR | Status: AC

## 2022-02-27 NOTE — H&P (Signed)
Referring Physician(s): Hammer,E  Supervising Physician: Malachy Moan  Patient Status:  [facility]  Chief Complaint:  Benign prostatic hypertrophy  Subjective: Steve Andrews is a  81 year old gentleman with a history of benign prostatic hyperplasia now almost 1 year status post prostatic artery embolization by IR. Unfortunately, he is developing recurrent lower urinary tract symptoms. He is strongly interested in undergoing repeat prostatic artery embolization. He would like to avoid surgery if at all possible. His wife is very ill and dying from cancer and he feels strongly that he needs to be available to care for her and cannot tolerate downtime postsurgery. He underwent f/u discussion recently with Dr. Archer Asa and presents today for repeat PAE to address current symptoms.  He denies fever,HA,CP,dyspnea, cough, abd/back pain,N/V or bleeding. He does have urinary frequency but no dysuria/hematuria.   Past Medical History:  Diagnosis Date   Bladder stone    BPH (benign prostatic hyperplasia)    History of kidney stones    Past Surgical History:  Procedure Laterality Date   COLONOSCOPY W/ POLYPECTOMY     IR 3D INDEPENDENT WKST  03/05/2021   IR ANGIOGRAM PELVIS SELECTIVE OR SUPRASELECTIVE  03/05/2021   IR ANGIOGRAM PELVIS SELECTIVE OR SUPRASELECTIVE  03/05/2021   IR ANGIOGRAM SELECTIVE EACH ADDITIONAL VESSEL  03/05/2021   IR ANGIOGRAM SELECTIVE EACH ADDITIONAL VESSEL  03/05/2021   IR EMBO TUMOR ORGAN ISCHEMIA INFARCT INC GUIDE ROADMAPPING  03/05/2021   IR RADIOLOGIST EVAL & MGMT  01/22/2021   IR RADIOLOGIST EVAL & MGMT  03/27/2021   IR RADIOLOGIST EVAL & MGMT  06/20/2021   IR RADIOLOGIST EVAL & MGMT  09/24/2021   IR RADIOLOGIST EVAL & MGMT  01/30/2022   IR US GUIDE VASC ACCESS RIGHT  03/05/2021   lithrotripsy     OPEN REDUCTION INTERNAL FIXATION (ORIF) DISTAL RADIAL FRACTURE Right 08/17/2020   Procedure: OPEN TREATMENT OF DISPLACED RIGHT DISTAL RADIUS FRACTURE;  Surgeon:  Mack Hook, MD;  Location: [facility] OR;  Service: Orthopedics;  Laterality: Right;     Allergies: Patient has no known allergies.  Medications: Prior to Admission medications   Medication Sig Start Date End Date Taking? Authorizing Provider  acetaminophen (TYLENOL) 325 MG tablet Take 2 tablets (650 mg total) by mouth every 6 (six) hours. 08/17/20  Yes Mack Hook, MD  cholecalciferol (VITAMIN D) 25 MCG (1000 UNIT) tablet Take 1,000 Units by mouth in the morning.   Yes [provider]  ibuprofen (ADVIL) 200 MG tablet Take 3 tablets (600 mg total) by mouth every 6 (six) hours. 08/17/20  Yes Mack Hook, MD  Multiple Vitamin (MULTIVITAMIN WITH MINERALS) TABS tablet Take 1 tablet by mouth in the morning.   Yes [provider]  Multiple Vitamins-Minerals (PRESERVISION AREDS PO) Take 1 tablet by mouth in the morning and at bedtime.   Yes [provider]  tamsulosin (FLOMAX) 0.4 MG CAPS capsule Take 1 capsule (0.4 mg total) by mouth at bedtime. 03/05/21  Yes Alene Mires, NP  DM-APAP-CPM (VICKS NYQUIL COLD & FLU NIGHT) 30-650-4 MG/30ML LIQD Take by mouth.    [provider]  methylPREDNISolone (MEDROL DOSEPAK) 4 MG TBPK tablet Taper dose. 03/05/21   Alene Mires, NP  Nirmatrelvir & Ritonavir (PAXLOVID) 20 x 150 MG & 10 x 100MG  TBPK Take 3 tablets by mouth in the morning and at bedtime.    [provider]  oxyCODONE (ROXICODONE) 5 MG immediate release tablet Take 1 tablet (5 mg total) by mouth every 6 (six) hours  as needed for severe pain. 08/17/20   Mack Hook, MD     Vital Signs: BP (!) 149/55 (BP Location: Left Arm)   Pulse 73   Temp 97.8 F (36.6 C) (Oral)   Ht 5\' 8"  (1.727 m)   Wt 177 lb (80.3 kg)   SpO2 97%   BMI 26.91 kg/m   Physical Exam: awake/alert; chest- CTA bilat; heart- RRR; abd- soft,+BS,NT; no LE edema  Imaging: No results found.  Labs:  CBC: Recent Labs    03/05/21 0815  WBC 4.7  HGB 15.1  HCT  44.7  PLT 181    COAGS: Recent Labs    03/05/21 0815  INR 1.0    BMP: Recent Labs    03/01/21 0808 03/05/21 0815 01/31/22 1304  NA  --  139  --   K  --  3.9  --   CL  --  105  --   CO2  --  25  --   GLUCOSE  --  124*  --   BUN  --  14  --   CALCIUM  --  8.9  --   CREATININE 0.70 0.56* 0.70  GFRNONAA  --  >60  --     LIVER FUNCTION TESTS: Recent Labs    03/05/21 0815  BILITOT 1.0  AST 18  ALT 18  ALKPHOS 65  PROT 6.8  ALBUMIN 3.8    Assessment and Plan: Steve Andrews is a  81 year old gentleman with a history of benign prostatic hyperplasia/urolithiasis now almost 1 year status post prostatic artery embolization by IR. Unfortunately, he is developing recurrent lower urinary tract symptoms. He is strongly interested in undergoing repeat prostatic artery embolization. He would like to avoid surgery if at all possible. His wife is very ill and dying from cancer and he feels strongly that he needs to be available to care for her and cannot tolerate downtime postsurgery. He underwent f/u discussion recently with Dr. 76 and presents today for repeat PAE to address current symptoms. Risks and benefits of procedure were discussed with the patient including, but not limited to bleeding, infection, vascular injury or contrast induced renal failure.  This interventional procedure involves the use of X-rays and because of the nature of the planned procedure, it is possible that we will have prolonged use of X-ray fluoroscopy.  Potential radiation risks to you include (but are not limited to) the following: - A slightly elevated risk for cancer  several years later in life. This risk is typically less than 0.5% percent. This risk is low in comparison to the normal incidence of human cancer, which is 33% for women and 50% for men according to the [employer]. - Radiation induced injury can include skin redness, resembling a rash, tissue breakdown / ulcers and hair  loss (which can be temporary or permanent).   The likelihood of either of these occurring depends on the difficulty of the procedure and whether you are sensitive to radiation due to previous procedures, disease, or genetic conditions.   IF your procedure requires a prolonged use of radiation, you will be notified and given written instructions for further action.  It is your responsibility to monitor the irradiated area for the 2 weeks following the procedure and to notify your physician if you are concerned that you have suffered a radiation induced injury.    All of the patient's questions were answered, patient is agreeable to proceed.  Consent signed and in chart.  LABS PENDING  Electronically Signed: D. Jeananne Rama, PA-C 02/27/2022, 8:25 AM   I spent a total of 20 minutes at the the patient's bedside AND on the patient's hospital floor or unit, greater than 50% of which was counseling/coordinating care for prostate artery embolization

## 2022-02-27 NOTE — Procedures (Signed)
Interventional Radiology Procedure Note  Procedure: Successful bilateral PAE  Complications: None  Estimated Blood Loss: None  Recommendations: - Bedrest x 1 hr - DC home  Signed,  Sterling Big, MD

## 2022-02-27 NOTE — Progress Notes (Signed)
Patient ID: Steve Andrews, male   DOB: 10-Apr-1940, 81 y.o.   MRN: [id number] Per order of Dr. Archer Asa, electronic prescriptions were sent to pt's pharmacy for bactrim, percocet and naprosyn. Pt updated.

## 2022-02-28 ENCOUNTER — Other Ambulatory Visit: Payer: Self-pay | Admitting: Interventional Radiology

## 2022-02-28 DIAGNOSIS — N401 Enlarged prostate with lower urinary tract symptoms: Secondary | ICD-10-CM

## 2022-03-19 DIAGNOSIS — H353231 Exudative age-related macular degeneration, bilateral, with active choroidal neovascularization: Secondary | ICD-10-CM | POA: Diagnosis not present

## 2022-03-19 DIAGNOSIS — H35372 Puckering of macula, left eye: Secondary | ICD-10-CM | POA: Diagnosis not present

## 2022-03-19 DIAGNOSIS — H43822 Vitreomacular adhesion, left eye: Secondary | ICD-10-CM | POA: Diagnosis not present

## 2022-03-25 ENCOUNTER — Ambulatory Visit
Admission: RE | Admit: 2022-03-25 | Discharge: 2022-03-25 | Disposition: A | Discharge status: Home / Self Care | Payer: PPO | Source: Ambulatory Visit | Attending: Interventional Radiology | Admitting: Interventional Radiology

## 2022-03-25 DIAGNOSIS — N401 Enlarged prostate with lower urinary tract symptoms: Secondary | ICD-10-CM | POA: Diagnosis not present

## 2022-03-25 DIAGNOSIS — Z9889 Other specified postprocedural states: Secondary | ICD-10-CM | POA: Diagnosis not present

## 2022-03-25 HISTORY — PX: IR RADIOLOGIST EVAL & MGMT: IMG5224

## 2022-03-25 NOTE — Progress Notes (Signed)
Chief Complaint: Patient was consulted remotely today (TeleHealth) for BPH with LUTS post repeat PAE at the request of Britian Jentz K.    Referring Physician(s): Cassity Christian K  History of Present Illness: Steve Andrews is a 82 y.o. male who presented in November of 2022 in his usual state of health to discuss minimally invasive treatment options for benign prostatic hyperplasia.  Steve Andrews is exceptionally healthy for his age.  He first went to the doctor for routine physical at age 48.  His primary medical issue is significant BPH with associated lower urinary tract symptoms and bladder stone formation.  He underwent cystoscopic surgical removal of his bladder stones with Dr. Lafayette Dragon of [facility] in the past.  The procedure was successful, however he found the recovery somewhat challenging.  At the time of presentation, his primary symptoms include incomplete emptying, intermittency, and extreme weakness.  I had him fill out the International prostate symptom score (IPSS) questionnaire at his first visit.  He scored a 21 out of 35.  This constituted severe symptoms.  His quality of life due to urinary symptoms was a 3 which is considered mixed.  Overall, he is extremely healthy and this is his only significant and nagging medical issue.  He underwent prostatic artery embolization on 03/05/2021.  The procedure was successful and uncomplicated.     At 3 and 70-month follow-up, he was doing very well with excellent clinical results.  He comes into the office today unfortunately with recurrent symptoms at 11 months post embolization.  Due to his recurrent significant lower urinary tract symptoms, he underwent repeat prostatic artery embolization on 02/27/2022.  The procedure went well.  There was clear evidence of recanalization of several branches of the prostatic arteries bilaterally.  Embolization was successful.  We spoke over the telephone today for his 1 month  follow-up evaluation.  Steve Andrews is pleased to report that his results have been "amazing".  He is urinating normally and in fact has even stopped his tamsulosin.  His only complaint is that he had some penile pain and frequent urination the first 2 days following the procedure from a slightly traumatic insertion of the Foley catheter.  However, this has resolved.  He is extremely pleased with his overall outcome and hopes that it lasts.  Past Medical History:  Diagnosis Date   Bladder stone    BPH (benign prostatic hyperplasia)    History of kidney stones     Past Surgical History:  Procedure Laterality Date   COLONOSCOPY W/ POLYPECTOMY     IR 3D INDEPENDENT WKST  03/05/2021   IR ANGIOGRAM PELVIS SELECTIVE OR SUPRASELECTIVE  03/05/2021   IR ANGIOGRAM PELVIS SELECTIVE OR SUPRASELECTIVE  03/05/2021   IR ANGIOGRAM PELVIS SELECTIVE OR SUPRASELECTIVE  02/27/2022   IR ANGIOGRAM SELECTIVE EACH ADDITIONAL VESSEL  03/05/2021   IR ANGIOGRAM SELECTIVE EACH ADDITIONAL VESSEL  03/05/2021   IR ANGIOGRAM SELECTIVE EACH ADDITIONAL VESSEL  02/27/2022   IR ANGIOGRAM SELECTIVE EACH ADDITIONAL VESSEL  02/27/2022   IR ANGIOGRAM SELECTIVE EACH ADDITIONAL VESSEL  02/27/2022   IR ANGIOGRAM SELECTIVE EACH ADDITIONAL VESSEL  02/27/2022   IR ANGIOGRAM SELECTIVE EACH ADDITIONAL VESSEL  02/27/2022   IR EMBO TUMOR ORGAN ISCHEMIA INFARCT INC GUIDE ROADMAPPING  03/05/2021   IR EMBO TUMOR ORGAN ISCHEMIA INFARCT INC GUIDE ROADMAPPING  02/27/2022   IR RADIOLOGIST EVAL & MGMT  01/22/2021   IR RADIOLOGIST EVAL & MGMT  03/27/2021   IR RADIOLOGIST EVAL & MGMT  06/20/2021  IR RADIOLOGIST EVAL & MGMT  09/24/2021   IR RADIOLOGIST EVAL & MGMT  01/30/2022   IR US GUIDE VASC ACCESS RIGHT  03/05/2021   IR US GUIDE VASC ACCESS RIGHT  02/27/2022   lithrotripsy     OPEN REDUCTION INTERNAL FIXATION (ORIF) DISTAL RADIAL FRACTURE Right 08/17/2020   Procedure: OPEN TREATMENT OF DISPLACED RIGHT DISTAL RADIUS FRACTURE;  Surgeon:  Milly Jakob, MD;  Location: [facility];  Service: Orthopedics;  Laterality: Right;    Allergies: Patient has no known allergies.  Medications: Prior to Admission medications   Medication Sig Start Date End Date Taking? Authorizing Provider  acetaminophen (TYLENOL) 325 MG tablet Take 2 tablets (650 mg total) by mouth every 6 (six) hours. 08/17/20   Milly Jakob, MD  cholecalciferol (VITAMIN D) 25 MCG (1000 UNIT) tablet Take 1,000 Units by mouth in the morning.    [provider]  DM-APAP-CPM (VICKS NYQUIL COLD & FLU NIGHT) 30-650-4 MG/30ML LIQD Take by mouth.    [provider]  ibuprofen (ADVIL) 200 MG tablet Take 3 tablets (600 mg total) by mouth every 6 (six) hours. 08/17/20   Milly Jakob, MD  methylPREDNISolone (MEDROL DOSEPAK) 4 MG TBPK tablet Taper dose. 03/05/21   Jacqualine Mau, NP  Multiple Vitamin (MULTIVITAMIN WITH MINERALS) TABS tablet Take 1 tablet by mouth in the morning.    [provider]  Multiple Vitamins-Minerals (PRESERVISION AREDS PO) Take 1 tablet by mouth in the morning and at bedtime.    [provider]  naproxen (NAPROSYN) 500 MG tablet Take 1 tablet (500 mg total) by mouth 2 (two) times daily with a meal. 02/27/22   Allred, Darrell K, PA-C  Nirmatrelvir & Ritonavir (PAXLOVID) 20 x 150 MG & 10 x 100MG  TBPK Take 3 tablets by mouth in the morning and at bedtime.    [provider]  oxyCODONE (ROXICODONE) 5 MG immediate release tablet Take 1 tablet (5 mg total) by mouth every 6 (six) hours as needed for severe pain. 08/17/20   Milly Jakob, MD  sulfamethoxazole-trimethoprim (BACTRIM) 400-80 MG tablet Take 1 tablet by mouth 2 (two) times daily. 02/27/22   Allred, Shirlyn Goltz, PA-C  tamsulosin (FLOMAX) 0.4 MG CAPS capsule Take 1 capsule (0.4 mg total) by mouth at bedtime. 03/05/21   Jacqualine Mau, NP     No family history on file.  Social History   Socioeconomic History   Marital status: Married    Spouse  name: Not on file   Number of children: Not on file   Years of education: Not on file   Highest education level: Not on file  Occupational History   Not on file  Tobacco Use   Smoking status: Former    Types: Cigarettes    Quit date: 05/16/1975    Years since quitting: 46.8   Smokeless tobacco: Never  Vaping Use   Vaping Use: Never used  Substance and Sexual Activity   Alcohol use: Yes    Alcohol/week: 12.0 standard drinks of alcohol    Types: 12 Glasses of wine per week   Drug use: Never   Sexual activity: Not on file  Other Topics Concern   Not on file  Social History Narrative   Not on file   Social Determinants of Health   Financial Resource Strain: Not on file  Food Insecurity: Not on file  Transportation Needs: Not on file  Physical Activity: Not on file  Stress: Not on file  Social Connections: Not on file  Review of Systems  Review of Systems: A 12 point ROS discussed and pertinent positives are indicated in the HPI above.  All other systems are negative.  Physical Exam No direct physical exam was performed (except for noted visual exam findings with Video Visits).    Vital Signs: There were no vitals taken for this visit.  Imaging: IR EMBO TUMOR ORGAN ISCHEMIA INFARCT INC GUIDE ROADMAPPING  Result Date: 02/27/2022 INDICATION: 82 year old male with advanced benign prostatic hyperplasia 0 with severe lower urinary tract symptoms. He had excellent results from initial PAE but now has recurrent symptoms. He presents for repeat prostatic artery embolization. EXAM: IR EMBO TUMOR ORGAN ISCHEMIA INFARCT INC GUIDE ROADMAPPING; ADDITIONAL ARTERIOGRAPHY; IR ULTRASOUND GUIDANCE VASC ACCESS RIGHT; PELVIC SELECTIVE ARTERIOGRAPHY MEDICATIONS: 8 mg Decadron, 80/40 Bactrim. The antibiotic was administered within 1 hour of the procedure ANESTHESIA/SEDATION: Moderate (conscious) sedation was employed during this procedure. A total of Versed 4 mg and Fentanyl 100 mcg was  administered intravenously. Moderate Sedation Time: 100 minutes. The patient's level of consciousness and vital signs were monitored continuously by radiology nursing throughout the procedure under my direct supervision. CONTRAST:  40mL OMNIPAQUE IOHEXOL 300 MG/ML SOLN, 27mL OMNIPAQUE IOHEXOL 300 MG/ML SOLN, 69mL OMNIPAQUE IOHEXOL 300 MG/ML SOLN FLUOROSCOPY: Radiation Exposure Index (as provided by the fluoroscopic device): 1,632 mGy Kerma COMPLICATIONS: None immediate. PROCEDURE: Informed consent was obtained from the patient following explanation of the procedure, risks, benefits and alternatives. The patient understands, agrees and consents for the procedure. All questions were addressed. A time out was performed prior to the initiation of the procedure. Maximal barrier sterile technique utilized including caps, mask, sterile gowns, sterile gloves, large sterile drape, hand hygiene, and Betadine prep. The right common femoral artery was interrogated with ultrasound and found to be widely patent. An image was obtained and stored for the medical record. Local anesthesia was attained by infiltration with 1% lidocaine. A small dermatotomy was made. Under real-time sonographic guidance, the vessel was punctured with a 21 gauge micropuncture needle. Using standard technique, the initial micro needle was exchanged over a 0.018 micro wire for a transitional 4 [place] micro sheath. The micro sheath was then exchanged over a 0.035 wire for a 5 French vascular sheath. A C2 cobra catheter was advanced over a Bentson wire up in over the aortic bifurcation and into the left internal iliac artery. An arteriogram was performed. The origin of the prostatic artery is visualized. There appears to be reconstitution of collateral branches proximal to the previously placed coil pack with reperfusion of the prostatic artery. The microcatheter was advanced over a Fathom 16 wire into the common trunk of the superior vesicular and  prostatic artery. Contrast injection was performed to assess the branching pattern. The microcatheter was then successfully advanced into the left prostatic artery. Contrast injection was performed. Significant parenchymal enhancement of the prostate gland secondary to a network of small arteries arising proximal to the previously placed coil pack. Particle embolization was performed using 100-300 micron embospheres. Once stasis was reached, additional coil embolization was performed. Follow-up contrast injection demonstrates no further enhancement of the prostatic parenchyma. The microcatheter was removed. The Cobra catheter was formed into a wall men's loop in brought back into the ipsilateral right internal iliac artery. Contrast injection was performed. The origin of the prostatic artery is again identified. The microcatheter was advanced into the prostatic artery. Contrast injection was performed. Robust enhancement of the prostate gland is visualized. There is both collateralization around the previously embolized medial branch as  well as extensive reopacification of capsular and lateral branches. The catheter was first advanced into the medial branch. Particle embolization was performed utilizing 100-300 micron embospheres. Once there was near stasis, additional coil embolization was performed. The microcatheter was then brought back into the lateral branch. Attempts were made to navigate the catheter more distally into the lateral branch, however the microcatheter continuously selected what appears to be a capsular prostatic artery. Coil embolization of this vessel was performed in order to block off this collateral pathway. The microcatheter was then successfully advanced into the lateral branch of the right prostatic artery. Particle embolization was performed using 100-300 micron embospheres. Final coil embolization was then performed. Follow-up contrast injection demonstrates no further parenchymal  enhancement of the prostate gland. IMPRESSION: 1. Confirmed collateralization and reperfusion of the prostate gland from bilateral prostatic artery supply. 2. Successful combined particle and coil embolization of the bilateral prostatic arteries. Electronically Signed   By: Jacqulynn Cadet M.D.   On: 02/27/2022 13:02   IR US Guide Vasc Access Right  Result Date: 02/27/2022 INDICATION: 82 year old male with advanced benign prostatic hyperplasia 0 with severe lower urinary tract symptoms. He had excellent results from initial PAE but now has recurrent symptoms. He presents for repeat prostatic artery embolization. EXAM: IR EMBO TUMOR ORGAN ISCHEMIA INFARCT INC GUIDE ROADMAPPING; ADDITIONAL ARTERIOGRAPHY; IR ULTRASOUND GUIDANCE VASC ACCESS RIGHT; PELVIC SELECTIVE ARTERIOGRAPHY MEDICATIONS: 8 mg Decadron, 80/40 Bactrim. The antibiotic was administered within 1 hour of the procedure ANESTHESIA/SEDATION: Moderate (conscious) sedation was employed during this procedure. A total of Versed 4 mg and Fentanyl 100 mcg was administered intravenously. Moderate Sedation Time: 100 minutes. The patient's level of consciousness and vital signs were monitored continuously by radiology nursing throughout the procedure under my direct supervision. CONTRAST:  40mL OMNIPAQUE IOHEXOL 300 MG/ML SOLN, 60mL OMNIPAQUE IOHEXOL 300 MG/ML SOLN, 59mL OMNIPAQUE IOHEXOL 300 MG/ML SOLN FLUOROSCOPY: Radiation Exposure Index (as provided by the fluoroscopic device): A999333 mGy Kerma COMPLICATIONS: None immediate. PROCEDURE: Informed consent was obtained from the patient following explanation of the procedure, risks, benefits and alternatives. The patient understands, agrees and consents for the procedure. All questions were addressed. A time out was performed prior to the initiation of the procedure. Maximal barrier sterile technique utilized including caps, mask, sterile gowns, sterile gloves, large sterile drape, hand hygiene, and Betadine  prep. The right common femoral artery was interrogated with ultrasound and found to be widely patent. An image was obtained and stored for the medical record. Local anesthesia was attained by infiltration with 1% lidocaine. A small dermatotomy was made. Under real-time sonographic guidance, the vessel was punctured with a 21 gauge micropuncture needle. Using standard technique, the initial micro needle was exchanged over a 0.018 micro wire for a transitional 4 [place] micro sheath. The micro sheath was then exchanged over a 0.035 wire for a 5 French vascular sheath. A C2 cobra catheter was advanced over a Bentson wire up in over the aortic bifurcation and into the left internal iliac artery. An arteriogram was performed. The origin of the prostatic artery is visualized. There appears to be reconstitution of collateral branches proximal to the previously placed coil pack with reperfusion of the prostatic artery. The microcatheter was advanced over a Fathom 16 wire into the common trunk of the superior vesicular and prostatic artery. Contrast injection was performed to assess the branching pattern. The microcatheter was then successfully advanced into the left prostatic artery. Contrast injection was performed. Significant parenchymal enhancement of the prostate gland secondary to a  network of small arteries arising proximal to the previously placed coil pack. Particle embolization was performed using 100-300 micron embospheres. Once stasis was reached, additional coil embolization was performed. Follow-up contrast injection demonstrates no further enhancement of the prostatic parenchyma. The microcatheter was removed. The Cobra catheter was formed into a wall men's loop in brought back into the ipsilateral right internal iliac artery. Contrast injection was performed. The origin of the prostatic artery is again identified. The microcatheter was advanced into the prostatic artery. Contrast injection was performed.  Robust enhancement of the prostate gland is visualized. There is both collateralization around the previously embolized medial branch as well as extensive reopacification of capsular and lateral branches. The catheter was first advanced into the medial branch. Particle embolization was performed utilizing 100-300 micron embospheres. Once there was near stasis, additional coil embolization was performed. The microcatheter was then brought back into the lateral branch. Attempts were made to navigate the catheter more distally into the lateral branch, however the microcatheter continuously selected what appears to be a capsular prostatic artery. Coil embolization of this vessel was performed in order to block off this collateral pathway. The microcatheter was then successfully advanced into the lateral branch of the right prostatic artery. Particle embolization was performed using 100-300 micron embospheres. Final coil embolization was then performed. Follow-up contrast injection demonstrates no further parenchymal enhancement of the prostate gland. IMPRESSION: 1. Confirmed collateralization and reperfusion of the prostate gland from bilateral prostatic artery supply. 2. Successful combined particle and coil embolization of the bilateral prostatic arteries. Electronically Signed   By: Jacqulynn Cadet M.D.   On: 02/27/2022 13:02   IR Angiogram Pelvis Selective Or Supraselective  Result Date: 02/27/2022 INDICATION: 82 year old male with advanced benign prostatic hyperplasia 0 with severe lower urinary tract symptoms. He had excellent results from initial PAE but now has recurrent symptoms. He presents for repeat prostatic artery embolization. EXAM: IR EMBO TUMOR ORGAN ISCHEMIA INFARCT INC GUIDE ROADMAPPING; ADDITIONAL ARTERIOGRAPHY; IR ULTRASOUND GUIDANCE VASC ACCESS RIGHT; PELVIC SELECTIVE ARTERIOGRAPHY MEDICATIONS: 8 mg Decadron, 80/40 Bactrim. The antibiotic was administered within 1 hour of the procedure  ANESTHESIA/SEDATION: Moderate (conscious) sedation was employed during this procedure. A total of Versed 4 mg and Fentanyl 100 mcg was administered intravenously. Moderate Sedation Time: 100 minutes. The patient's level of consciousness and vital signs were monitored continuously by radiology nursing throughout the procedure under my direct supervision. CONTRAST:  51mL OMNIPAQUE IOHEXOL 300 MG/ML SOLN, 45mL OMNIPAQUE IOHEXOL 300 MG/ML SOLN, 34mL OMNIPAQUE IOHEXOL 300 MG/ML SOLN FLUOROSCOPY: Radiation Exposure Index (as provided by the fluoroscopic device): A999333 mGy Kerma COMPLICATIONS: None immediate. PROCEDURE: Informed consent was obtained from the patient following explanation of the procedure, risks, benefits and alternatives. The patient understands, agrees and consents for the procedure. All questions were addressed. A time out was performed prior to the initiation of the procedure. Maximal barrier sterile technique utilized including caps, mask, sterile gowns, sterile gloves, large sterile drape, hand hygiene, and Betadine prep. The right common femoral artery was interrogated with ultrasound and found to be widely patent. An image was obtained and stored for the medical record. Local anesthesia was attained by infiltration with 1% lidocaine. A small dermatotomy was made. Under real-time sonographic guidance, the vessel was punctured with a 21 gauge micropuncture needle. Using standard technique, the initial micro needle was exchanged over a 0.018 micro wire for a transitional 4 [place] micro sheath. The micro sheath was then exchanged over a 0.035 wire for a 5 French vascular sheath. A C2  cobra catheter was advanced over a Bentson wire up in over the aortic bifurcation and into the left internal iliac artery. An arteriogram was performed. The origin of the prostatic artery is visualized. There appears to be reconstitution of collateral branches proximal to the previously placed coil pack with reperfusion of  the prostatic artery. The microcatheter was advanced over a Fathom 16 wire into the common trunk of the superior vesicular and prostatic artery. Contrast injection was performed to assess the branching pattern. The microcatheter was then successfully advanced into the left prostatic artery. Contrast injection was performed. Significant parenchymal enhancement of the prostate gland secondary to a network of small arteries arising proximal to the previously placed coil pack. Particle embolization was performed using 100-300 micron embospheres. Once stasis was reached, additional coil embolization was performed. Follow-up contrast injection demonstrates no further enhancement of the prostatic parenchyma. The microcatheter was removed. The Cobra catheter was formed into a wall men's loop in brought back into the ipsilateral right internal iliac artery. Contrast injection was performed. The origin of the prostatic artery is again identified. The microcatheter was advanced into the prostatic artery. Contrast injection was performed. Robust enhancement of the prostate gland is visualized. There is both collateralization around the previously embolized medial branch as well as extensive reopacification of capsular and lateral branches. The catheter was first advanced into the medial branch. Particle embolization was performed utilizing 100-300 micron embospheres. Once there was near stasis, additional coil embolization was performed. The microcatheter was then brought back into the lateral branch. Attempts were made to navigate the catheter more distally into the lateral branch, however the microcatheter continuously selected what appears to be a capsular prostatic artery. Coil embolization of this vessel was performed in order to block off this collateral pathway. The microcatheter was then successfully advanced into the lateral branch of the right prostatic artery. Particle embolization was performed using 100-300 micron  embospheres. Final coil embolization was then performed. Follow-up contrast injection demonstrates no further parenchymal enhancement of the prostate gland. IMPRESSION: 1. Confirmed collateralization and reperfusion of the prostate gland from bilateral prostatic artery supply. 2. Successful combined particle and coil embolization of the bilateral prostatic arteries. Electronically Signed   By: Jacqulynn Cadet M.D.   On: 02/27/2022 13:02   IR Angiogram Selective Each Additional Vessel  Result Date: 02/27/2022 INDICATION: 82 year old male with advanced benign prostatic hyperplasia 0 with severe lower urinary tract symptoms. He had excellent results from initial PAE but now has recurrent symptoms. He presents for repeat prostatic artery embolization. EXAM: IR EMBO TUMOR ORGAN ISCHEMIA INFARCT INC GUIDE ROADMAPPING; ADDITIONAL ARTERIOGRAPHY; IR ULTRASOUND GUIDANCE VASC ACCESS RIGHT; PELVIC SELECTIVE ARTERIOGRAPHY MEDICATIONS: 8 mg Decadron, 80/40 Bactrim. The antibiotic was administered within 1 hour of the procedure ANESTHESIA/SEDATION: Moderate (conscious) sedation was employed during this procedure. A total of Versed 4 mg and Fentanyl 100 mcg was administered intravenously. Moderate Sedation Time: 100 minutes. The patient's level of consciousness and vital signs were monitored continuously by radiology nursing throughout the procedure under my direct supervision. CONTRAST:  16mL OMNIPAQUE IOHEXOL 300 MG/ML SOLN, 68mL OMNIPAQUE IOHEXOL 300 MG/ML SOLN, 26mL OMNIPAQUE IOHEXOL 300 MG/ML SOLN FLUOROSCOPY: Radiation Exposure Index (as provided by the fluoroscopic device): A999333 mGy Kerma COMPLICATIONS: None immediate. PROCEDURE: Informed consent was obtained from the patient following explanation of the procedure, risks, benefits and alternatives. The patient understands, agrees and consents for the procedure. All questions were addressed. A time out was performed prior to the initiation of the procedure. Maximal  barrier sterile  technique utilized including caps, mask, sterile gowns, sterile gloves, large sterile drape, hand hygiene, and Betadine prep. The right common femoral artery was interrogated with ultrasound and found to be widely patent. An image was obtained and stored for the medical record. Local anesthesia was attained by infiltration with 1% lidocaine. A small dermatotomy was made. Under real-time sonographic guidance, the vessel was punctured with a 21 gauge micropuncture needle. Using standard technique, the initial micro needle was exchanged over a 0.018 micro wire for a transitional 4 [place] micro sheath. The micro sheath was then exchanged over a 0.035 wire for a 5 French vascular sheath. A C2 cobra catheter was advanced over a Bentson wire up in over the aortic bifurcation and into the left internal iliac artery. An arteriogram was performed. The origin of the prostatic artery is visualized. There appears to be reconstitution of collateral branches proximal to the previously placed coil pack with reperfusion of the prostatic artery. The microcatheter was advanced over a Fathom 16 wire into the common trunk of the superior vesicular and prostatic artery. Contrast injection was performed to assess the branching pattern. The microcatheter was then successfully advanced into the left prostatic artery. Contrast injection was performed. Significant parenchymal enhancement of the prostate gland secondary to a network of small arteries arising proximal to the previously placed coil pack. Particle embolization was performed using 100-300 micron embospheres. Once stasis was reached, additional coil embolization was performed. Follow-up contrast injection demonstrates no further enhancement of the prostatic parenchyma. The microcatheter was removed. The Cobra catheter was formed into a wall men's loop in brought back into the ipsilateral right internal iliac artery. Contrast injection was performed. The origin of  the prostatic artery is again identified. The microcatheter was advanced into the prostatic artery. Contrast injection was performed. Robust enhancement of the prostate gland is visualized. There is both collateralization around the previously embolized medial branch as well as extensive reopacification of capsular and lateral branches. The catheter was first advanced into the medial branch. Particle embolization was performed utilizing 100-300 micron embospheres. Once there was near stasis, additional coil embolization was performed. The microcatheter was then brought back into the lateral branch. Attempts were made to navigate the catheter more distally into the lateral branch, however the microcatheter continuously selected what appears to be a capsular prostatic artery. Coil embolization of this vessel was performed in order to block off this collateral pathway. The microcatheter was then successfully advanced into the lateral branch of the right prostatic artery. Particle embolization was performed using 100-300 micron embospheres. Final coil embolization was then performed. Follow-up contrast injection demonstrates no further parenchymal enhancement of the prostate gland. IMPRESSION: 1. Confirmed collateralization and reperfusion of the prostate gland from bilateral prostatic artery supply. 2. Successful combined particle and coil embolization of the bilateral prostatic arteries. Electronically Signed   By: Jacqulynn Cadet M.D.   On: 02/27/2022 13:02   IR Angiogram Selective Each Additional Vessel  Result Date: 02/27/2022 INDICATION: 82 year old male with advanced benign prostatic hyperplasia 0 with severe lower urinary tract symptoms. He had excellent results from initial PAE but now has recurrent symptoms. He presents for repeat prostatic artery embolization. EXAM: IR EMBO TUMOR ORGAN ISCHEMIA INFARCT INC GUIDE ROADMAPPING; ADDITIONAL ARTERIOGRAPHY; IR ULTRASOUND GUIDANCE VASC ACCESS RIGHT; PELVIC  SELECTIVE ARTERIOGRAPHY MEDICATIONS: 8 mg Decadron, 80/40 Bactrim. The antibiotic was administered within 1 hour of the procedure ANESTHESIA/SEDATION: Moderate (conscious) sedation was employed during this procedure. A total of Versed 4 mg and Fentanyl 100 mcg was administered  intravenously. Moderate Sedation Time: 100 minutes. The patient's level of consciousness and vital signs were monitored continuously by radiology nursing throughout the procedure under my direct supervision. CONTRAST:  68mL OMNIPAQUE IOHEXOL 300 MG/ML SOLN, 75mL OMNIPAQUE IOHEXOL 300 MG/ML SOLN, 40mL OMNIPAQUE IOHEXOL 300 MG/ML SOLN FLUOROSCOPY: Radiation Exposure Index (as provided by the fluoroscopic device): A999333 mGy Kerma COMPLICATIONS: None immediate. PROCEDURE: Informed consent was obtained from the patient following explanation of the procedure, risks, benefits and alternatives. The patient understands, agrees and consents for the procedure. All questions were addressed. A time out was performed prior to the initiation of the procedure. Maximal barrier sterile technique utilized including caps, mask, sterile gowns, sterile gloves, large sterile drape, hand hygiene, and Betadine prep. The right common femoral artery was interrogated with ultrasound and found to be widely patent. An image was obtained and stored for the medical record. Local anesthesia was attained by infiltration with 1% lidocaine. A small dermatotomy was made. Under real-time sonographic guidance, the vessel was punctured with a 21 gauge micropuncture needle. Using standard technique, the initial micro needle was exchanged over a 0.018 micro wire for a transitional 4 [place] micro sheath. The micro sheath was then exchanged over a 0.035 wire for a 5 French vascular sheath. A C2 cobra catheter was advanced over a Bentson wire up in over the aortic bifurcation and into the left internal iliac artery. An arteriogram was performed. The origin of the prostatic artery is  visualized. There appears to be reconstitution of collateral branches proximal to the previously placed coil pack with reperfusion of the prostatic artery. The microcatheter was advanced over a Fathom 16 wire into the common trunk of the superior vesicular and prostatic artery. Contrast injection was performed to assess the branching pattern. The microcatheter was then successfully advanced into the left prostatic artery. Contrast injection was performed. Significant parenchymal enhancement of the prostate gland secondary to a network of small arteries arising proximal to the previously placed coil pack. Particle embolization was performed using 100-300 micron embospheres. Once stasis was reached, additional coil embolization was performed. Follow-up contrast injection demonstrates no further enhancement of the prostatic parenchyma. The microcatheter was removed. The Cobra catheter was formed into a wall men's loop in brought back into the ipsilateral right internal iliac artery. Contrast injection was performed. The origin of the prostatic artery is again identified. The microcatheter was advanced into the prostatic artery. Contrast injection was performed. Robust enhancement of the prostate gland is visualized. There is both collateralization around the previously embolized medial branch as well as extensive reopacification of capsular and lateral branches. The catheter was first advanced into the medial branch. Particle embolization was performed utilizing 100-300 micron embospheres. Once there was near stasis, additional coil embolization was performed. The microcatheter was then brought back into the lateral branch. Attempts were made to navigate the catheter more distally into the lateral branch, however the microcatheter continuously selected what appears to be a capsular prostatic artery. Coil embolization of this vessel was performed in order to block off this collateral pathway. The microcatheter was then  successfully advanced into the lateral branch of the right prostatic artery. Particle embolization was performed using 100-300 micron embospheres. Final coil embolization was then performed. Follow-up contrast injection demonstrates no further parenchymal enhancement of the prostate gland. IMPRESSION: 1. Confirmed collateralization and reperfusion of the prostate gland from bilateral prostatic artery supply. 2. Successful combined particle and coil embolization of the bilateral prostatic arteries. Electronically Signed   By: Dellis Filbert.D.  On: 02/27/2022 13:02   IR Angiogram Selective Each Additional Vessel  Result Date: 02/27/2022 INDICATION: 82 year old male with advanced benign prostatic hyperplasia 0 with severe lower urinary tract symptoms. He had excellent results from initial PAE but now has recurrent symptoms. He presents for repeat prostatic artery embolization. EXAM: IR EMBO TUMOR ORGAN ISCHEMIA INFARCT INC GUIDE ROADMAPPING; ADDITIONAL ARTERIOGRAPHY; IR ULTRASOUND GUIDANCE VASC ACCESS RIGHT; PELVIC SELECTIVE ARTERIOGRAPHY MEDICATIONS: 8 mg Decadron, 80/40 Bactrim. The antibiotic was administered within 1 hour of the procedure ANESTHESIA/SEDATION: Moderate (conscious) sedation was employed during this procedure. A total of Versed 4 mg and Fentanyl 100 mcg was administered intravenously. Moderate Sedation Time: 100 minutes. The patient's level of consciousness and vital signs were monitored continuously by radiology nursing throughout the procedure under my direct supervision. CONTRAST:  77mL OMNIPAQUE IOHEXOL 300 MG/ML SOLN, 74mL OMNIPAQUE IOHEXOL 300 MG/ML SOLN, 19mL OMNIPAQUE IOHEXOL 300 MG/ML SOLN FLUOROSCOPY: Radiation Exposure Index (as provided by the fluoroscopic device): A999333 mGy Kerma COMPLICATIONS: None immediate. PROCEDURE: Informed consent was obtained from the patient following explanation of the procedure, risks, benefits and alternatives. The patient understands, agrees and  consents for the procedure. All questions were addressed. A time out was performed prior to the initiation of the procedure. Maximal barrier sterile technique utilized including caps, mask, sterile gowns, sterile gloves, large sterile drape, hand hygiene, and Betadine prep. The right common femoral artery was interrogated with ultrasound and found to be widely patent. An image was obtained and stored for the medical record. Local anesthesia was attained by infiltration with 1% lidocaine. A small dermatotomy was made. Under real-time sonographic guidance, the vessel was punctured with a 21 gauge micropuncture needle. Using standard technique, the initial micro needle was exchanged over a 0.018 micro wire for a transitional 4 [place] micro sheath. The micro sheath was then exchanged over a 0.035 wire for a 5 French vascular sheath. A C2 cobra catheter was advanced over a Bentson wire up in over the aortic bifurcation and into the left internal iliac artery. An arteriogram was performed. The origin of the prostatic artery is visualized. There appears to be reconstitution of collateral branches proximal to the previously placed coil pack with reperfusion of the prostatic artery. The microcatheter was advanced over a Fathom 16 wire into the common trunk of the superior vesicular and prostatic artery. Contrast injection was performed to assess the branching pattern. The microcatheter was then successfully advanced into the left prostatic artery. Contrast injection was performed. Significant parenchymal enhancement of the prostate gland secondary to a network of small arteries arising proximal to the previously placed coil pack. Particle embolization was performed using 100-300 micron embospheres. Once stasis was reached, additional coil embolization was performed. Follow-up contrast injection demonstrates no further enhancement of the prostatic parenchyma. The microcatheter was removed. The Cobra catheter was formed into a  wall men's loop in brought back into the ipsilateral right internal iliac artery. Contrast injection was performed. The origin of the prostatic artery is again identified. The microcatheter was advanced into the prostatic artery. Contrast injection was performed. Robust enhancement of the prostate gland is visualized. There is both collateralization around the previously embolized medial branch as well as extensive reopacification of capsular and lateral branches. The catheter was first advanced into the medial branch. Particle embolization was performed utilizing 100-300 micron embospheres. Once there was near stasis, additional coil embolization was performed. The microcatheter was then brought back into the lateral branch. Attempts were made to navigate the catheter more distally into the lateral  branch, however the microcatheter continuously selected what appears to be a capsular prostatic artery. Coil embolization of this vessel was performed in order to block off this collateral pathway. The microcatheter was then successfully advanced into the lateral branch of the right prostatic artery. Particle embolization was performed using 100-300 micron embospheres. Final coil embolization was then performed. Follow-up contrast injection demonstrates no further parenchymal enhancement of the prostate gland. IMPRESSION: 1. Confirmed collateralization and reperfusion of the prostate gland from bilateral prostatic artery supply. 2. Successful combined particle and coil embolization of the bilateral prostatic arteries. Electronically Signed   By: Jacqulynn Cadet M.D.   On: 02/27/2022 13:02   IR Angiogram Selective Each Additional Vessel  Result Date: 02/27/2022 INDICATION: 82 year old male with advanced benign prostatic hyperplasia 0 with severe lower urinary tract symptoms. He had excellent results from initial PAE but now has recurrent symptoms. He presents for repeat prostatic artery embolization. EXAM: IR EMBO  TUMOR ORGAN ISCHEMIA INFARCT INC GUIDE ROADMAPPING; ADDITIONAL ARTERIOGRAPHY; IR ULTRASOUND GUIDANCE VASC ACCESS RIGHT; PELVIC SELECTIVE ARTERIOGRAPHY MEDICATIONS: 8 mg Decadron, 80/40 Bactrim. The antibiotic was administered within 1 hour of the procedure ANESTHESIA/SEDATION: Moderate (conscious) sedation was employed during this procedure. A total of Versed 4 mg and Fentanyl 100 mcg was administered intravenously. Moderate Sedation Time: 100 minutes. The patient's level of consciousness and vital signs were monitored continuously by radiology nursing throughout the procedure under my direct supervision. CONTRAST:  67mL OMNIPAQUE IOHEXOL 300 MG/ML SOLN, 21mL OMNIPAQUE IOHEXOL 300 MG/ML SOLN, 54mL OMNIPAQUE IOHEXOL 300 MG/ML SOLN FLUOROSCOPY: Radiation Exposure Index (as provided by the fluoroscopic device): 9,629 mGy Kerma COMPLICATIONS: None immediate. PROCEDURE: Informed consent was obtained from the patient following explanation of the procedure, risks, benefits and alternatives. The patient understands, agrees and consents for the procedure. All questions were addressed. A time out was performed prior to the initiation of the procedure. Maximal barrier sterile technique utilized including caps, mask, sterile gowns, sterile gloves, large sterile drape, hand hygiene, and Betadine prep. The right common femoral artery was interrogated with ultrasound and found to be widely patent. An image was obtained and stored for the medical record. Local anesthesia was attained by infiltration with 1% lidocaine. A small dermatotomy was made. Under real-time sonographic guidance, the vessel was punctured with a 21 gauge micropuncture needle. Using standard technique, the initial micro needle was exchanged over a 0.018 micro wire for a transitional 4 [place] micro sheath. The micro sheath was then exchanged over a 0.035 wire for a 5 French vascular sheath. A C2 cobra catheter was advanced over a Bentson wire up in over the  aortic bifurcation and into the left internal iliac artery. An arteriogram was performed. The origin of the prostatic artery is visualized. There appears to be reconstitution of collateral branches proximal to the previously placed coil pack with reperfusion of the prostatic artery. The microcatheter was advanced over a Fathom 16 wire into the common trunk of the superior vesicular and prostatic artery. Contrast injection was performed to assess the branching pattern. The microcatheter was then successfully advanced into the left prostatic artery. Contrast injection was performed. Significant parenchymal enhancement of the prostate gland secondary to a network of small arteries arising proximal to the previously placed coil pack. Particle embolization was performed using 100-300 micron embospheres. Once stasis was reached, additional coil embolization was performed. Follow-up contrast injection demonstrates no further enhancement of the prostatic parenchyma. The microcatheter was removed. The Cobra catheter was formed into a wall men's loop in brought back into  the ipsilateral right internal iliac artery. Contrast injection was performed. The origin of the prostatic artery is again identified. The microcatheter was advanced into the prostatic artery. Contrast injection was performed. Robust enhancement of the prostate gland is visualized. There is both collateralization around the previously embolized medial branch as well as extensive reopacification of capsular and lateral branches. The catheter was first advanced into the medial branch. Particle embolization was performed utilizing 100-300 micron embospheres. Once there was near stasis, additional coil embolization was performed. The microcatheter was then brought back into the lateral branch. Attempts were made to navigate the catheter more distally into the lateral branch, however the microcatheter continuously selected what appears to be a capsular prostatic  artery. Coil embolization of this vessel was performed in order to block off this collateral pathway. The microcatheter was then successfully advanced into the lateral branch of the right prostatic artery. Particle embolization was performed using 100-300 micron embospheres. Final coil embolization was then performed. Follow-up contrast injection demonstrates no further parenchymal enhancement of the prostate gland. IMPRESSION: 1. Confirmed collateralization and reperfusion of the prostate gland from bilateral prostatic artery supply. 2. Successful combined particle and coil embolization of the bilateral prostatic arteries. Electronically Signed   By: Jacqulynn Cadet M.D.   On: 02/27/2022 13:02   IR Angiogram Selective Each Additional Vessel  Result Date: 02/27/2022 INDICATION: 82 year old male with advanced benign prostatic hyperplasia 0 with severe lower urinary tract symptoms. He had excellent results from initial PAE but now has recurrent symptoms. He presents for repeat prostatic artery embolization. EXAM: IR EMBO TUMOR ORGAN ISCHEMIA INFARCT INC GUIDE ROADMAPPING; ADDITIONAL ARTERIOGRAPHY; IR ULTRASOUND GUIDANCE VASC ACCESS RIGHT; PELVIC SELECTIVE ARTERIOGRAPHY MEDICATIONS: 8 mg Decadron, 80/40 Bactrim. The antibiotic was administered within 1 hour of the procedure ANESTHESIA/SEDATION: Moderate (conscious) sedation was employed during this procedure. A total of Versed 4 mg and Fentanyl 100 mcg was administered intravenously. Moderate Sedation Time: 100 minutes. The patient's level of consciousness and vital signs were monitored continuously by radiology nursing throughout the procedure under my direct supervision. CONTRAST:  3mL OMNIPAQUE IOHEXOL 300 MG/ML SOLN, 7mL OMNIPAQUE IOHEXOL 300 MG/ML SOLN, 64mL OMNIPAQUE IOHEXOL 300 MG/ML SOLN FLUOROSCOPY: Radiation Exposure Index (as provided by the fluoroscopic device): A999333 mGy Kerma COMPLICATIONS: None immediate. PROCEDURE: Informed consent was obtained  from the patient following explanation of the procedure, risks, benefits and alternatives. The patient understands, agrees and consents for the procedure. All questions were addressed. A time out was performed prior to the initiation of the procedure. Maximal barrier sterile technique utilized including caps, mask, sterile gowns, sterile gloves, large sterile drape, hand hygiene, and Betadine prep. The right common femoral artery was interrogated with ultrasound and found to be widely patent. An image was obtained and stored for the medical record. Local anesthesia was attained by infiltration with 1% lidocaine. A small dermatotomy was made. Under real-time sonographic guidance, the vessel was punctured with a 21 gauge micropuncture needle. Using standard technique, the initial micro needle was exchanged over a 0.018 micro wire for a transitional 4 [place] micro sheath. The micro sheath was then exchanged over a 0.035 wire for a 5 French vascular sheath. A C2 cobra catheter was advanced over a Bentson wire up in over the aortic bifurcation and into the left internal iliac artery. An arteriogram was performed. The origin of the prostatic artery is visualized. There appears to be reconstitution of collateral branches proximal to the previously placed coil pack with reperfusion of the prostatic artery. The microcatheter was advanced  over a Fathom 16 wire into the common trunk of the superior vesicular and prostatic artery. Contrast injection was performed to assess the branching pattern. The microcatheter was then successfully advanced into the left prostatic artery. Contrast injection was performed. Significant parenchymal enhancement of the prostate gland secondary to a network of small arteries arising proximal to the previously placed coil pack. Particle embolization was performed using 100-300 micron embospheres. Once stasis was reached, additional coil embolization was performed. Follow-up contrast injection  demonstrates no further enhancement of the prostatic parenchyma. The microcatheter was removed. The Cobra catheter was formed into a wall men's loop in brought back into the ipsilateral right internal iliac artery. Contrast injection was performed. The origin of the prostatic artery is again identified. The microcatheter was advanced into the prostatic artery. Contrast injection was performed. Robust enhancement of the prostate gland is visualized. There is both collateralization around the previously embolized medial branch as well as extensive reopacification of capsular and lateral branches. The catheter was first advanced into the medial branch. Particle embolization was performed utilizing 100-300 micron embospheres. Once there was near stasis, additional coil embolization was performed. The microcatheter was then brought back into the lateral branch. Attempts were made to navigate the catheter more distally into the lateral branch, however the microcatheter continuously selected what appears to be a capsular prostatic artery. Coil embolization of this vessel was performed in order to block off this collateral pathway. The microcatheter was then successfully advanced into the lateral branch of the right prostatic artery. Particle embolization was performed using 100-300 micron embospheres. Final coil embolization was then performed. Follow-up contrast injection demonstrates no further parenchymal enhancement of the prostate gland. IMPRESSION: 1. Confirmed collateralization and reperfusion of the prostate gland from bilateral prostatic artery supply. 2. Successful combined particle and coil embolization of the bilateral prostatic arteries. Electronically Signed   By: Jacqulynn Cadet M.D.   On: 02/27/2022 13:02    Labs:  CBC: Recent Labs    02/27/22 0820  WBC 5.1  HGB 14.2  HCT 42.5  PLT 144*    COAGS: Recent Labs    02/27/22 0820  INR 1.0    BMP: Recent Labs    01/31/22 1304  02/27/22 0820  NA  --  139  K  --  4.0  CL  --  106  CO2  --  27  GLUCOSE  --  131*  BUN  --  15  CALCIUM  --  9.1  CREATININE 0.70 0.65  GFRNONAA  --  >60    LIVER FUNCTION TESTS: Recent Labs    02/27/22 0820  BILITOT 0.7  AST 26  ALT 30  ALKPHOS 75  PROT 6.8  ALBUMIN 3.5    TUMOR MARKERS: No results for input(s): "AFPTM", "CEA", "CA199", "CHROMGRNA" in the last 8760 hours.  Assessment and Plan:  82 year old gentleman with significant benign prostatic hyperplasia and severe lower urinary tract symptoms.  He underwent initial prostate artery embolization on March 05, 2021 and had excellent relief of symptoms up until October 2022 when his symptoms became recurrent.  He is now status post repeat PE on 02/27/2022.  Doing extremely well with complete resolution of lower urinary tract symptoms.  He has stopped tamsulosin and is urinating normally.  He is extremely pleased with his overall outcome.  1.) Follow up clinic visit in 3 months.   Electronically Signed: Criselda Andrews 03/25/2022, 8:25 AM   I spent a total of  15 Minutes in remote  clinical consultation,  greater than 50% of which was counseling/coordinating care for BPH with LUTS s/p PAE.    Visit type: Audio only (telephone). Audio (no video) only due to patient preference. Alternative for in-person consultation at [facility], [address], [place], [place]. This visit type was conducted due to national recommendations for restrictions regarding the COVID-19 Pandemic (e.g. social distancing).  This format is felt to be most appropriate for this patient at this time.  All issues noted in this document were discussed and addressed.

## 2022-05-21 DIAGNOSIS — H35372 Puckering of macula, left eye: Secondary | ICD-10-CM | POA: Diagnosis not present

## 2022-05-21 DIAGNOSIS — H353231 Exudative age-related macular degeneration, bilateral, with active choroidal neovascularization: Secondary | ICD-10-CM | POA: Diagnosis not present

## 2022-05-21 DIAGNOSIS — H43822 Vitreomacular adhesion, left eye: Secondary | ICD-10-CM | POA: Diagnosis not present

## 2022-05-26 DIAGNOSIS — Z86010 Personal history of colonic polyps: Secondary | ICD-10-CM | POA: Diagnosis not present

## 2022-05-26 DIAGNOSIS — Z136 Encounter for screening for cardiovascular disorders: Secondary | ICD-10-CM | POA: Diagnosis not present

## 2022-05-26 DIAGNOSIS — E559 Vitamin D deficiency, unspecified: Secondary | ICD-10-CM | POA: Diagnosis not present

## 2022-05-26 DIAGNOSIS — R739 Hyperglycemia, unspecified: Secondary | ICD-10-CM | POA: Diagnosis not present

## 2022-05-26 DIAGNOSIS — N400 Enlarged prostate without lower urinary tract symptoms: Secondary | ICD-10-CM | POA: Diagnosis not present

## 2022-05-26 DIAGNOSIS — Z0000 Encounter for general adult medical examination without abnormal findings: Secondary | ICD-10-CM | POA: Diagnosis not present

## 2022-05-26 DIAGNOSIS — H3530 Unspecified macular degeneration: Secondary | ICD-10-CM | POA: Diagnosis not present

## 2022-05-26 DIAGNOSIS — Z125 Encounter for screening for malignant neoplasm of prostate: Secondary | ICD-10-CM | POA: Diagnosis not present

## 2022-05-26 DIAGNOSIS — Z1159 Encounter for screening for other viral diseases: Secondary | ICD-10-CM | POA: Diagnosis not present

## 2022-05-26 DIAGNOSIS — Z13220 Encounter for screening for lipoid disorders: Secondary | ICD-10-CM | POA: Diagnosis not present

## 2022-05-30 ENCOUNTER — Other Ambulatory Visit: Payer: Self-pay | Admitting: Interventional Radiology

## 2022-05-30 DIAGNOSIS — N401 Enlarged prostate with lower urinary tract symptoms: Secondary | ICD-10-CM

## 2022-06-23 ENCOUNTER — Other Ambulatory Visit: Payer: Self-pay | Admitting: Internal Medicine

## 2022-06-23 MED ORDER — TAMSULOSIN HCL 0.4 MG PO CAPS: 0.4000 mg | ORAL_CAPSULE | Freq: Every day | ORAL | 4 refills | Status: DC

## 2022-06-24 ENCOUNTER — Ambulatory Visit
Admission: RE | Admit: 2022-06-24 | Discharge: 2022-06-24 | Disposition: A | Discharge status: Home / Self Care | Payer: PPO | Source: Ambulatory Visit | Attending: Interventional Radiology | Admitting: Interventional Radiology

## 2022-06-24 DIAGNOSIS — R338 Other retention of urine: Secondary | ICD-10-CM

## 2022-06-24 DIAGNOSIS — N401 Enlarged prostate with lower urinary tract symptoms: Secondary | ICD-10-CM | POA: Diagnosis not present

## 2022-06-24 HISTORY — PX: IR RADIOLOGIST EVAL & MGMT: IMG5224

## 2022-06-24 NOTE — Progress Notes (Signed)
Chief Complaint: Patient was consulted remotely today (TeleHealth) for BPH with LUTS at the request of [name].    Referring Physician(s): Yoselyn Mcglade K  History of Present Illness: Steve Andrews is a 82 y.o. male who presented in November of 2022 in his usual state of health to discuss minimally invasive treatment options for benign prostatic hyperplasia.  Mr. Lance CoonSpiller is exceptionally healthy for his age.  He first went to the doctor for routine physical at age 82.  His primary medical issue is significant BPH with associated lower urinary tract symptoms and bladder stone formation.  He underwent cystoscopic surgical removal of his bladder stones with Dr. Janeece AgeeMark Bell of [facility] in the past.  The procedure was successful, however he found the recovery somewhat challenging.  At the time of presentation, his primary symptoms include incomplete emptying, intermittency, and extreme weakness.  I had him fill out the International prostate symptom score (IPSS) questionnaire at his first visit.  He scored a 21 out of 35.  This constituted severe symptoms.  His quality of life due to urinary symptoms was a 3 which is considered mixed.  Overall, he is extremely healthy and this is his only significant and nagging medical issue.  He underwent prostatic artery embolization on 03/05/2021.  The procedure was successful and uncomplicated.     At 3 and 5942-month follow-up, he was doing very well with excellent clinical results.  He comes into the office today unfortunately with recurrent symptoms at 11 months post embolization.   Due to his recurrent significant lower urinary tract symptoms, he underwent repeat prostatic artery embolization on 02/27/2022.  The procedure went well.  There was clear evidence of recanalization of several branches of the prostatic arteries bilaterally.  Embolization was successful.   We spoke over the telephone today for his 3 month follow-up  evaluation.   He continues to do exceptionally well and has no recurrent lower urinary tract symptoms.   Past Medical History:  Diagnosis Date   Bladder stone    BPH (benign prostatic hyperplasia)    History of kidney stones     Past Surgical History:  Procedure Laterality Date   COLONOSCOPY W/ POLYPECTOMY     IR 3D INDEPENDENT WKST  03/05/2021   IR ANGIOGRAM PELVIS SELECTIVE OR SUPRASELECTIVE  03/05/2021   IR ANGIOGRAM PELVIS SELECTIVE OR SUPRASELECTIVE  03/05/2021   IR ANGIOGRAM PELVIS SELECTIVE OR SUPRASELECTIVE  02/27/2022   IR ANGIOGRAM SELECTIVE EACH ADDITIONAL VESSEL  03/05/2021   IR ANGIOGRAM SELECTIVE EACH ADDITIONAL VESSEL  03/05/2021   IR ANGIOGRAM SELECTIVE EACH ADDITIONAL VESSEL  02/27/2022   IR ANGIOGRAM SELECTIVE EACH ADDITIONAL VESSEL  02/27/2022   IR ANGIOGRAM SELECTIVE EACH ADDITIONAL VESSEL  02/27/2022   IR ANGIOGRAM SELECTIVE EACH ADDITIONAL VESSEL  02/27/2022   IR ANGIOGRAM SELECTIVE EACH ADDITIONAL VESSEL  02/27/2022   IR EMBO TUMOR ORGAN ISCHEMIA INFARCT INC GUIDE ROADMAPPING  03/05/2021   IR EMBO TUMOR ORGAN ISCHEMIA INFARCT INC GUIDE ROADMAPPING  02/27/2022   IR RADIOLOGIST EVAL & MGMT  01/22/2021   IR RADIOLOGIST EVAL & MGMT  03/27/2021   IR RADIOLOGIST EVAL & MGMT  06/20/2021   IR RADIOLOGIST EVAL & MGMT  09/24/2021   IR RADIOLOGIST EVAL & MGMT  01/30/2022   IR RADIOLOGIST EVAL & MGMT  03/25/2022   IR RADIOLOGIST EVAL & MGMT  06/24/2022   IR US GUIDE VASC ACCESS RIGHT  03/05/2021   IR US GUIDE VASC ACCESS RIGHT  02/27/2022   lithrotripsy  OPEN REDUCTION INTERNAL FIXATION (ORIF) DISTAL RADIAL FRACTURE Right 08/17/2020   Procedure: OPEN TREATMENT OF DISPLACED RIGHT DISTAL RADIUS FRACTURE;  Surgeon: Mack Hook, MD;  Location: [facility] OR;  Service: Orthopedics;  Laterality: Right;    Allergies: Patient has no known allergies.  Medications: Prior to Admission medications   Medication Sig Start Date End Date Taking? Authorizing Provider  acetaminophen  (TYLENOL) 325 MG tablet Take 2 tablets (650 mg total) by mouth every 6 (six) hours. 08/17/20   Mack Hook, MD  cholecalciferol (VITAMIN D) 25 MCG (1000 UNIT) tablet Take 1,000 Units by mouth in the morning.    [provider]  DM-APAP-CPM (VICKS NYQUIL COLD & FLU NIGHT) 30-650-4 MG/30ML LIQD Take by mouth.    [provider]  ibuprofen (ADVIL) 200 MG tablet Take 3 tablets (600 mg total) by mouth every 6 (six) hours. 08/17/20   Mack Hook, MD  methylPREDNISolone (MEDROL DOSEPAK) 4 MG TBPK tablet Taper dose. 03/05/21   Alene Mires, NP  Multiple Vitamin (MULTIVITAMIN WITH MINERALS) TABS tablet Take 1 tablet by mouth in the morning.    [provider]  Multiple Vitamins-Minerals (PRESERVISION AREDS PO) Take 1 tablet by mouth in the morning and at bedtime.    [provider]  naproxen (NAPROSYN) 500 MG tablet Take 1 tablet (500 mg total) by mouth 2 (two) times daily with a meal. 02/27/22   Allred, Darrell K, PA-C  Nirmatrelvir & Ritonavir (PAXLOVID) 20 x 150 MG & 10 x 100MG  TBPK Take 3 tablets by mouth in the morning and at bedtime.    [provider]  oxyCODONE (ROXICODONE) 5 MG immediate release tablet Take 1 tablet (5 mg total) by mouth every 6 (six) hours as needed for severe pain. 08/17/20   Mack Hook, MD  sulfamethoxazole-trimethoprim (BACTRIM) 400-80 MG tablet Take 1 tablet by mouth 2 (two) times daily. 02/27/22   Allred, Rosalita Levan, PA-C  tamsulosin (FLOMAX) 0.4 MG CAPS capsule Take 1 capsule (0.4 mg total) by mouth at bedtime. 06/23/22   Shon Hough, NP     No family history on file.  Social History   Socioeconomic History   Marital status: Married    Spouse name: Not on file   Number of children: Not on file   Years of education: Not on file   Highest education level: Not on file  Occupational History   Not on file  Tobacco Use   Smoking status: Former    Types: Cigarettes    Quit date: 05/16/1975    Years since  quitting: 47.1   Smokeless tobacco: Never  Vaping Use   Vaping Use: Never used  Substance and Sexual Activity   Alcohol use: Yes    Alcohol/week: 12.0 standard drinks of alcohol    Types: 12 Glasses of wine per week   Drug use: Never   Sexual activity: Not on file  Other Topics Concern   Not on file  Social History Narrative   Not on file   Social Determinants of Health   Financial Resource Strain: Not on file  Food Insecurity: Not on file  Transportation Needs: Not on file  Physical Activity: Not on file  Stress: Not on file  Social Connections: Not on file    Review of Systems  Review of Systems: A 12 point ROS discussed and pertinent positives are indicated in the HPI above.  All other systems are negative.   Physical Exam No direct physical exam was performed (except for noted visual  exam findings with Video Visits).    Vital Signs: There were no vitals taken for this visit.  Imaging: IR Radiologist Eval & Mgmt  Result Date: 06/24/2022 EXAM: ESTABLISHED PATIENT OFFICE VISIT CHIEF COMPLAINT: SEE EPIC NOTE HISTORY OF PRESENT ILLNESS: SEE EPIC NOTE REVIEW OF SYSTEMS: SEE EPIC NOTE PHYSICAL EXAMINATION: SEE EPIC NOTE ASSESSMENT AND PLAN: SEE EPIC NOTE Electronically Signed   By: Malachy Moan M.D.   On: 06/24/2022 09:22    Labs:  CBC: Recent Labs    02/27/22 0820  WBC 5.1  HGB 14.2  HCT 42.5  PLT 144*    COAGS: Recent Labs    02/27/22 0820  INR 1.0    BMP: Recent Labs    01/31/22 1304 02/27/22 0820  NA  --  139  K  --  4.0  CL  --  106  CO2  --  27  GLUCOSE  --  131*  BUN  --  15  CALCIUM  --  9.1  CREATININE 0.70 0.65  GFRNONAA  --  >60    LIVER FUNCTION TESTS: Recent Labs    02/27/22 0820  BILITOT 0.7  AST 26  ALT 30  ALKPHOS 75  PROT 6.8  ALBUMIN 3.5    TUMOR MARKERS: No results for input(s): "AFPTM", "CEA", "CA199", "CHROMGRNA" in the last 8760 hours.  Assessment and Plan:  Very pleasant 82 year old gentleman doing  extremely well 3 weeks status post repeat prostatic artery embolization.  His lower urinary tract symptoms remain significantly improved.  Here is requiring no medications.  He is pleased with his results.  1.)  Follow-up clinic visit at 6 months postprocedure (mid June 2024)    Electronically Signed: Kandis Cocking Yareli Carthen 06/24/2022, 1:13 PM   I spent a total of  10 Minutes in remote  clinical consultation, greater than 50% of which was counseling/coordinating care for BPH with LUTS.    Visit type: Audio only (telephone). Audio (no video) only due to patient preference. Alternative for in-person consultation at [facility], [address] [place], [place], [place]. This visit type was conducted due to national recommendations for restrictions regarding the COVID-19 Pandemic (e.g. social distancing).  This format is felt to be most appropriate for this patient at this time.  All issues noted in this document were discussed and addressed.

## 2022-07-30 DIAGNOSIS — H43822 Vitreomacular adhesion, left eye: Secondary | ICD-10-CM | POA: Diagnosis not present

## 2022-07-30 DIAGNOSIS — H35372 Puckering of macula, left eye: Secondary | ICD-10-CM | POA: Diagnosis not present

## 2022-07-30 DIAGNOSIS — H353231 Exudative age-related macular degeneration, bilateral, with active choroidal neovascularization: Secondary | ICD-10-CM | POA: Diagnosis not present

## 2022-08-08 ENCOUNTER — Other Ambulatory Visit: Payer: Self-pay | Admitting: Interventional Radiology

## 2022-08-08 DIAGNOSIS — N401 Enlarged prostate with lower urinary tract symptoms: Secondary | ICD-10-CM

## 2022-08-22 DIAGNOSIS — S20469A Insect bite (nonvenomous) of unspecified back wall of thorax, initial encounter: Secondary | ICD-10-CM | POA: Diagnosis not present

## 2022-08-22 DIAGNOSIS — W57XXXA Bitten or stung by nonvenomous insect and other nonvenomous arthropods, initial encounter: Secondary | ICD-10-CM | POA: Diagnosis not present

## 2022-08-22 DIAGNOSIS — D485 Neoplasm of uncertain behavior of skin: Secondary | ICD-10-CM | POA: Diagnosis not present

## 2022-09-04 ENCOUNTER — Ambulatory Visit
Admission: RE | Admit: 2022-09-04 | Discharge: 2022-09-04 | Disposition: A | Discharge status: Home / Self Care | Payer: PPO | Source: Ambulatory Visit | Attending: Interventional Radiology | Admitting: Interventional Radiology

## 2022-09-04 DIAGNOSIS — R338 Other retention of urine: Secondary | ICD-10-CM

## 2022-09-04 DIAGNOSIS — N401 Enlarged prostate with lower urinary tract symptoms: Secondary | ICD-10-CM | POA: Diagnosis not present

## 2022-09-04 HISTORY — PX: IR RADIOLOGIST EVAL & MGMT: IMG5224

## 2022-09-04 NOTE — Progress Notes (Signed)
Chief Complaint: Patient was consulted remotely today (TeleHealth) for BPH with LUTS  at the request of Torien Ramroop K.    Referring Physician(s): Lurlie Wigen K  History of Present Illness: Steve Andrews is a 82 y.o. male who presented in November of 2022 in his usual state of health to discuss minimally invasive treatment options for benign prostatic hyperplasia.  Steve Andrews is exceptionally healthy for his age.  He first went to the doctor for routine physical at age 89.  His primary medical issue is significant BPH with associated lower urinary tract symptoms and bladder stone formation.  He underwent cystoscopic surgical removal of his bladder stones with Dr. Janeece Agee of [facility] in the past.  The procedure was successful, however he found the recovery somewhat challenging.  At the time of presentation, his primary symptoms include incomplete emptying, intermittency, and extreme weakness.  I had him fill out the International prostate symptom score (IPSS) questionnaire at his first visit.  He scored a 21 out of 35.  This constituted severe symptoms.  His quality of life due to urinary symptoms was a 3 which is considered mixed.  Overall, he is extremely healthy and this is his only significant and nagging medical issue.  He underwent prostatic artery embolization on 03/05/2021.  The procedure was successful and uncomplicated.     At 3 and 38-month follow-up, he was doing very well with excellent clinical results.  He comes into the office today unfortunately with recurrent symptoms at 11 months post embolization.   Due to his recurrent significant lower urinary tract symptoms, he underwent repeat prostatic artery embolization on 02/27/2022.  The procedure went well.  There was clear evidence of recanalization of several branches of the prostatic arteries bilaterally.  Embolization was successful.   We spoke over the telephone today for his 6 month follow-up  evaluation.   He continues to do exceptionally well and has no recurrent lower urinary tract symptoms.  He is extremely pleased.  His wife is doing well.   Past Medical History:  Diagnosis Date   Bladder stone    BPH (benign prostatic hyperplasia)    History of kidney stones     Past Surgical History:  Procedure Laterality Date   COLONOSCOPY W/ POLYPECTOMY     IR 3D INDEPENDENT WKST  03/05/2021   IR ANGIOGRAM PELVIS SELECTIVE OR SUPRASELECTIVE  03/05/2021   IR ANGIOGRAM PELVIS SELECTIVE OR SUPRASELECTIVE  03/05/2021   IR ANGIOGRAM PELVIS SELECTIVE OR SUPRASELECTIVE  02/27/2022   IR ANGIOGRAM SELECTIVE EACH ADDITIONAL VESSEL  03/05/2021   IR ANGIOGRAM SELECTIVE EACH ADDITIONAL VESSEL  03/05/2021   IR ANGIOGRAM SELECTIVE EACH ADDITIONAL VESSEL  02/27/2022   IR ANGIOGRAM SELECTIVE EACH ADDITIONAL VESSEL  02/27/2022   IR ANGIOGRAM SELECTIVE EACH ADDITIONAL VESSEL  02/27/2022   IR ANGIOGRAM SELECTIVE EACH ADDITIONAL VESSEL  02/27/2022   IR ANGIOGRAM SELECTIVE EACH ADDITIONAL VESSEL  02/27/2022   IR EMBO TUMOR ORGAN ISCHEMIA INFARCT INC GUIDE ROADMAPPING  03/05/2021   IR EMBO TUMOR ORGAN ISCHEMIA INFARCT INC GUIDE ROADMAPPING  02/27/2022   IR RADIOLOGIST EVAL & MGMT  01/22/2021   IR RADIOLOGIST EVAL & MGMT  03/27/2021   IR RADIOLOGIST EVAL & MGMT  06/20/2021   IR RADIOLOGIST EVAL & MGMT  09/24/2021   IR RADIOLOGIST EVAL & MGMT  01/30/2022   IR RADIOLOGIST EVAL & MGMT  03/25/2022   IR RADIOLOGIST EVAL & MGMT  06/24/2022   IR RADIOLOGIST EVAL & MGMT  09/04/2022   IR  US GUIDE VASC ACCESS RIGHT  03/05/2021   IR US GUIDE VASC ACCESS RIGHT  02/27/2022   lithrotripsy     OPEN REDUCTION INTERNAL FIXATION (ORIF) DISTAL RADIAL FRACTURE Right 08/17/2020   Procedure: OPEN TREATMENT OF DISPLACED RIGHT DISTAL RADIUS FRACTURE;  Surgeon: Mack Hook, MD;  Location: [facility] OR;  Service: Orthopedics;  Laterality: Right;    Allergies: Patient has no known allergies.  Medications: Prior to Admission  medications   Medication Sig Start Date End Date Taking? Authorizing Provider  acetaminophen (TYLENOL) 325 MG tablet Take 2 tablets (650 mg total) by mouth every 6 (six) hours. 08/17/20   Mack Hook, MD  cholecalciferol (VITAMIN D) 25 MCG (1000 UNIT) tablet Take 1,000 Units by mouth in the morning.    [provider]  DM-APAP-CPM (VICKS NYQUIL COLD & FLU NIGHT) 30-650-4 MG/30ML LIQD Take by mouth.    [provider]  ibuprofen (ADVIL) 200 MG tablet Take 3 tablets (600 mg total) by mouth every 6 (six) hours. 08/17/20   Mack Hook, MD  methylPREDNISolone (MEDROL DOSEPAK) 4 MG TBPK tablet Taper dose. 03/05/21   Alene Mires, NP  Multiple Vitamin (MULTIVITAMIN WITH MINERALS) TABS tablet Take 1 tablet by mouth in the morning.    [provider]  Multiple Vitamins-Minerals (PRESERVISION AREDS PO) Take 1 tablet by mouth in the morning and at bedtime.    [provider]  naproxen (NAPROSYN) 500 MG tablet Take 1 tablet (500 mg total) by mouth 2 (two) times daily with a meal. 02/27/22   Allred, Darrell K, PA-C  Nirmatrelvir & Ritonavir (PAXLOVID) 20 x 150 MG & 10 x 100MG  TBPK Take 3 tablets by mouth in the morning and at bedtime.    [provider]  oxyCODONE (ROXICODONE) 5 MG immediate release tablet Take 1 tablet (5 mg total) by mouth every 6 (six) hours as needed for severe pain. 08/17/20   Mack Hook, MD  sulfamethoxazole-trimethoprim (BACTRIM) 400-80 MG tablet Take 1 tablet by mouth 2 (two) times daily. 02/27/22   Allred, Rosalita Levan, PA-C  tamsulosin (FLOMAX) 0.4 MG CAPS capsule Take 1 capsule (0.4 mg total) by mouth at bedtime. 06/23/22   Shon Hough, NP     No family history on file.  Social History   Socioeconomic History   Marital status: Married    Spouse name: Not on file   Number of children: Not on file   Years of education: Not on file   Highest education level: Not on file  Occupational History   Not on file  Tobacco Use    Smoking status: Former    Types: Cigarettes    Quit date: 05/16/1975    Years since quitting: 47.3   Smokeless tobacco: Never  Vaping Use   Vaping Use: Never used  Substance and Sexual Activity   Alcohol use: Yes    Alcohol/week: 12.0 standard drinks of alcohol    Types: 12 Glasses of wine per week   Drug use: Never   Sexual activity: Not on file  Other Topics Concern   Not on file  Social History Narrative   Not on file   Social Determinants of Health   Financial Resource Strain: Not on file  Food Insecurity: Not on file  Transportation Needs: Not on file  Physical Activity: Not on file  Stress: Not on file  Social Connections: Not on file    Review of Systems  Review of Systems: A 12 point ROS discussed and pertinent positives are indicated  in the HPI above.  All other systems are negative.  Advance Care Plan: The advanced care plan/surrogate decision maker was discussed at the time of visit and the patient did not wish to discuss or was not able to name a surrogate decision maker or provide an advance care plan.    Physical Exam No direct physical exam was performed (except for noted visual exam findings with Video Visits).    Vital Signs: There were no vitals taken for this visit.  Imaging: IR Radiologist Eval & Mgmt  Result Date: 09/04/2022 EXAM: ESTABLISHED PATIENT OFFICE VISIT CHIEF COMPLAINT: SEE EPIC NOTE HISTORY OF PRESENT ILLNESS: SEE EPIC NOTE REVIEW OF SYSTEMS: SEE EPIC NOTE PHYSICAL EXAMINATION: SEE EPIC NOTE ASSESSMENT AND PLAN: SEE EPIC NOTE Electronically Signed   By: Malachy Moan M.D.   On: 09/04/2022 08:13    Labs:  CBC: Recent Labs    02/27/22 0820  WBC 5.1  HGB 14.2  HCT 42.5  PLT 144*    COAGS: Recent Labs    02/27/22 0820  INR 1.0    BMP: Recent Labs    01/31/22 1304 02/27/22 0820  NA  --  139  K  --  4.0  CL  --  106  CO2  --  27  GLUCOSE  --  131*  BUN  --  15  CALCIUM  --  9.1  CREATININE 0.70 0.65  GFRNONAA   --  >60    LIVER FUNCTION TESTS: Recent Labs    02/27/22 0820  BILITOT 0.7  AST 26  ALT 30  ALKPHOS 75  PROT 6.8  ALBUMIN 3.5    TUMOR MARKERS: No results for input(s): "AFPTM", "CEA", "CA199", "CHROMGRNA" in the last 8760 hours.  Assessment and Plan:  Very pleasant 82 year old gentleman doing extremely well 6 months status post repeat prostatic artery embolization.  His lower urinary tract symptoms remain significantly improved.  Here is requiring no medications.  He is pleased with his results.   1.)  No further scheduled follow-up.  He will reach out if he develops recurrent symptoms in the future.     Electronically Signed: Sterling Big 09/04/2022, 9:03 AM   I spent a total of  15 Minutes in remote  clinical consultation, greater than 50% of which was counseling/coordinating care for BPH with LUTS.    Visit type: Audio only (telephone). Audio (no video) only due to patient preference. Alternative for in-person consultation at [facility], [address] [place], [place], [place]. This visit type was conducted due to national recommendations for restrictions regarding the COVID-19 Pandemic (e.g. social distancing).  This format is felt to be most appropriate for this patient at this time.  All issues noted in this document were discussed and addressed.

## 2022-10-22 DIAGNOSIS — H353231 Exudative age-related macular degeneration, bilateral, with active choroidal neovascularization: Secondary | ICD-10-CM | POA: Diagnosis not present

## 2022-10-22 DIAGNOSIS — H43822 Vitreomacular adhesion, left eye: Secondary | ICD-10-CM | POA: Diagnosis not present

## 2022-10-22 DIAGNOSIS — H35372 Puckering of macula, left eye: Secondary | ICD-10-CM | POA: Diagnosis not present

## 2022-11-11 DIAGNOSIS — H6123 Impacted cerumen, bilateral: Secondary | ICD-10-CM | POA: Diagnosis not present

## 2022-11-11 DIAGNOSIS — H1033 Unspecified acute conjunctivitis, bilateral: Secondary | ICD-10-CM | POA: Diagnosis not present

## 2023-01-02 ENCOUNTER — Encounter: Payer: Self-pay | Admitting: Nurse Practitioner

## 2023-01-20 DIAGNOSIS — H35372 Puckering of macula, left eye: Secondary | ICD-10-CM | POA: Diagnosis not present

## 2023-01-20 DIAGNOSIS — H353231 Exudative age-related macular degeneration, bilateral, with active choroidal neovascularization: Secondary | ICD-10-CM | POA: Diagnosis not present

## 2023-01-20 DIAGNOSIS — H43822 Vitreomacular adhesion, left eye: Secondary | ICD-10-CM | POA: Diagnosis not present

## 2023-04-02 IMAGING — RF DG WRIST COMPLETE 3+V*R*
1 series · 3 of 3 positions shown · non-contrast
Comparison: None.

CLINICAL DATA: ORIF right wrist.

EXAM:
DG C-ARM 1-60 MIN; RIGHT WRIST - COMPLETE 3+ VIEW
FLUOROSCOPY TIME:  Fluoroscopy Time:  24 second.
Radiation Exposure Index (if provided by the fluoroscopic device):
0.22 mGy
Number of Acquired Spot Images: 3

[Series 1: run · 3 of 3 slices shown]
[im 1/3]
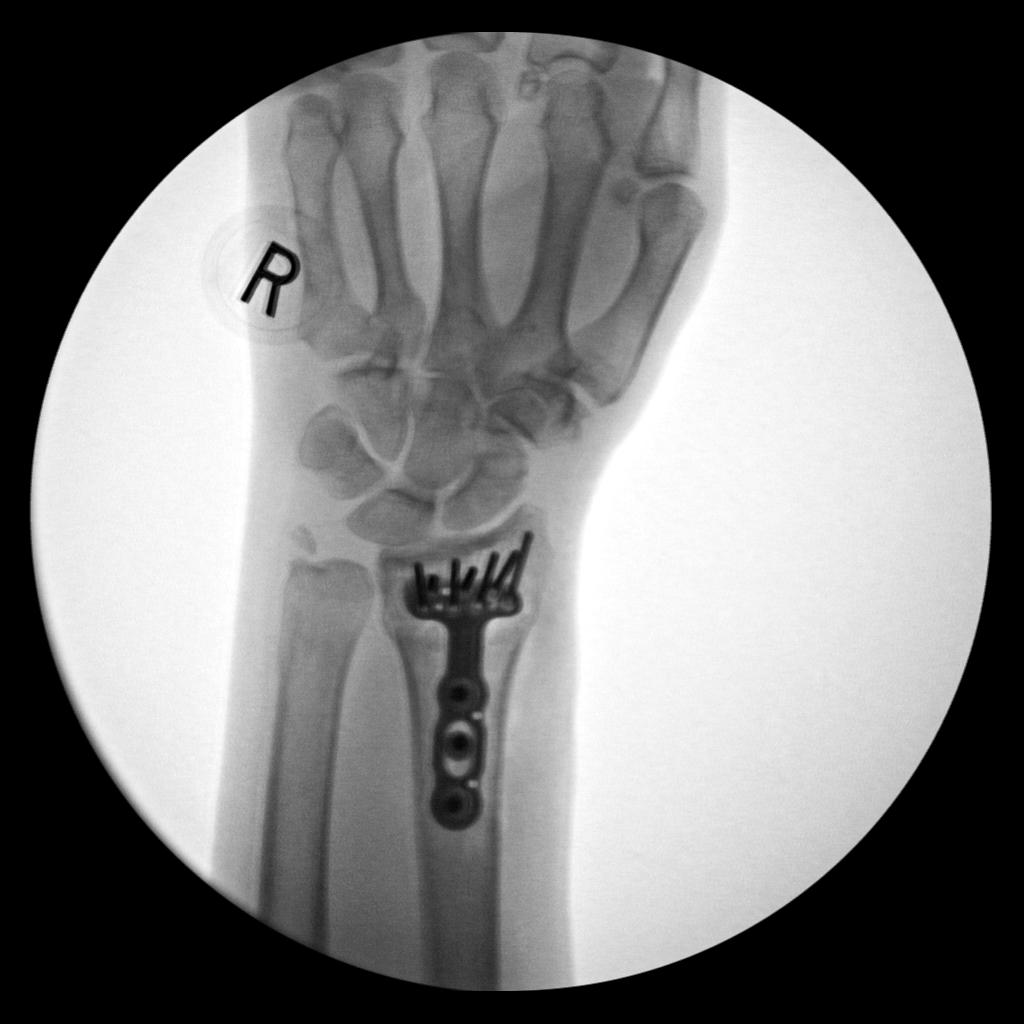
[im 2/3]
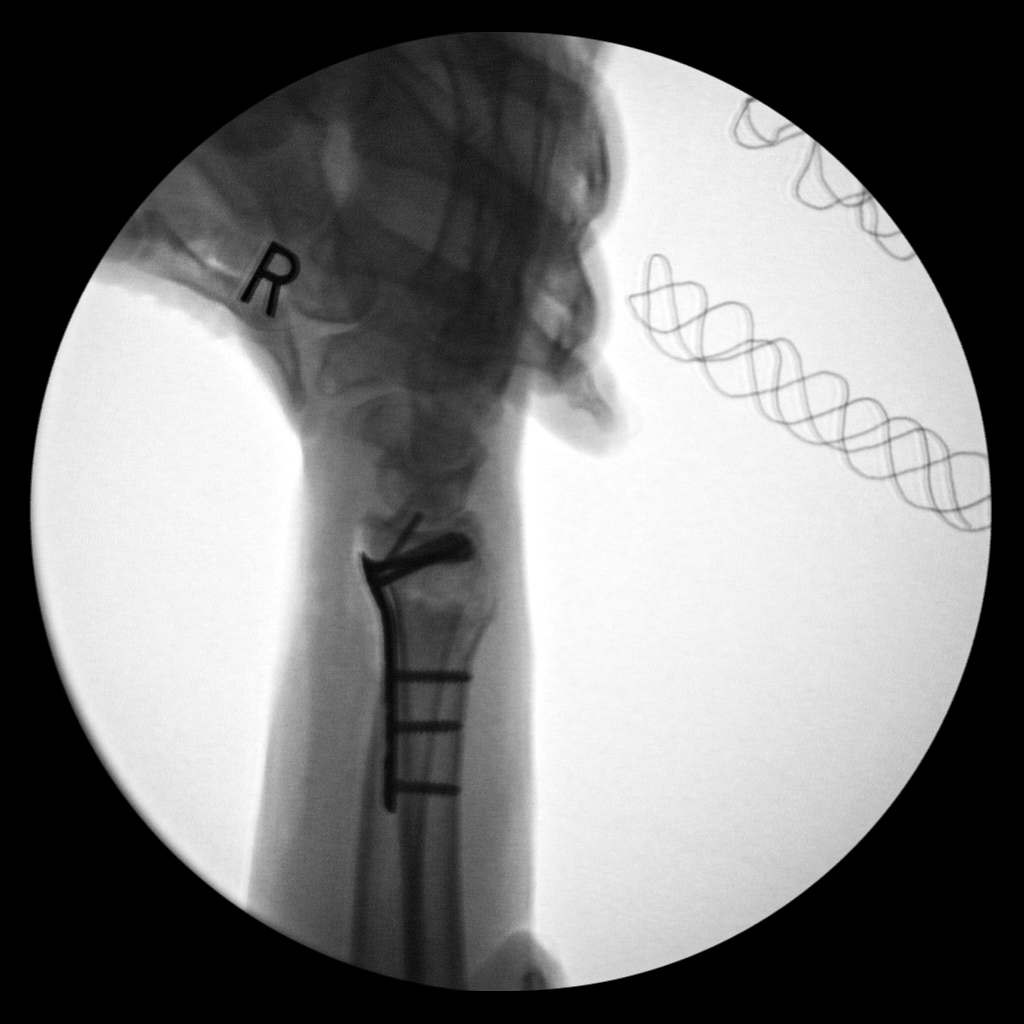
[im 3/3]
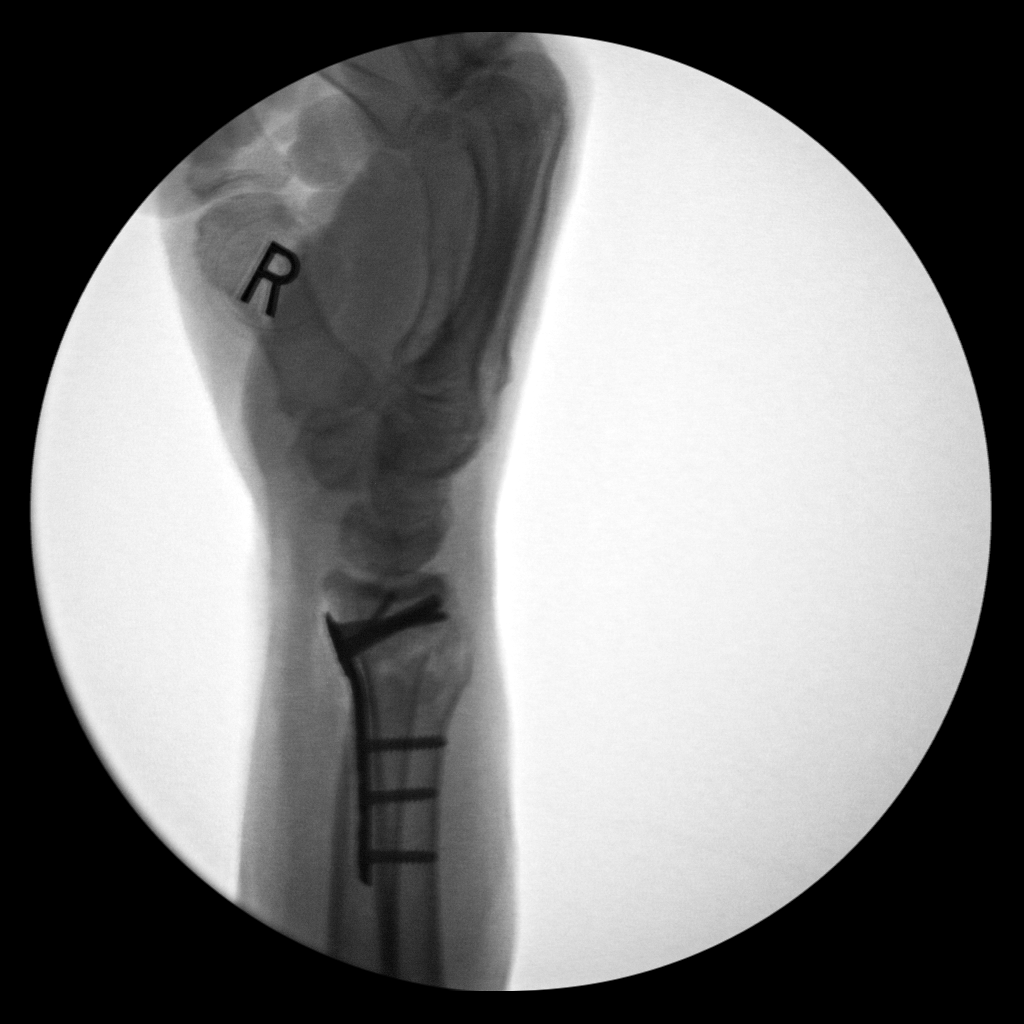

[3 of 3 positions shown; findings below may reference images not displayed]

FINDINGS: Three C-arm fluoroscopic images were obtained intraoperatively and
submitted for post operative interpretation. These images
demonstrate plate and screw fixation of a distal radial fracture.
Alignment is near anatomic. Please see the performing provider's
procedural report for further detail.
IMPRESSION: Intraoperative fluoroscopy, as detailed above.

## 2023-04-02 IMAGING — RF DG C-ARM 1-60 MIN
1 series · 3 of 3 positions shown · non-contrast
Comparison: None.

CLINICAL DATA: ORIF right wrist.

EXAM:
DG C-ARM 1-60 MIN; RIGHT WRIST - COMPLETE 3+ VIEW
FLUOROSCOPY TIME:  Fluoroscopy Time:  24 second.
Radiation Exposure Index (if provided by the fluoroscopic device):
0.22 mGy
Number of Acquired Spot Images: 3

[Series 1: run · 3 of 3 slices shown]
[im 1/3]
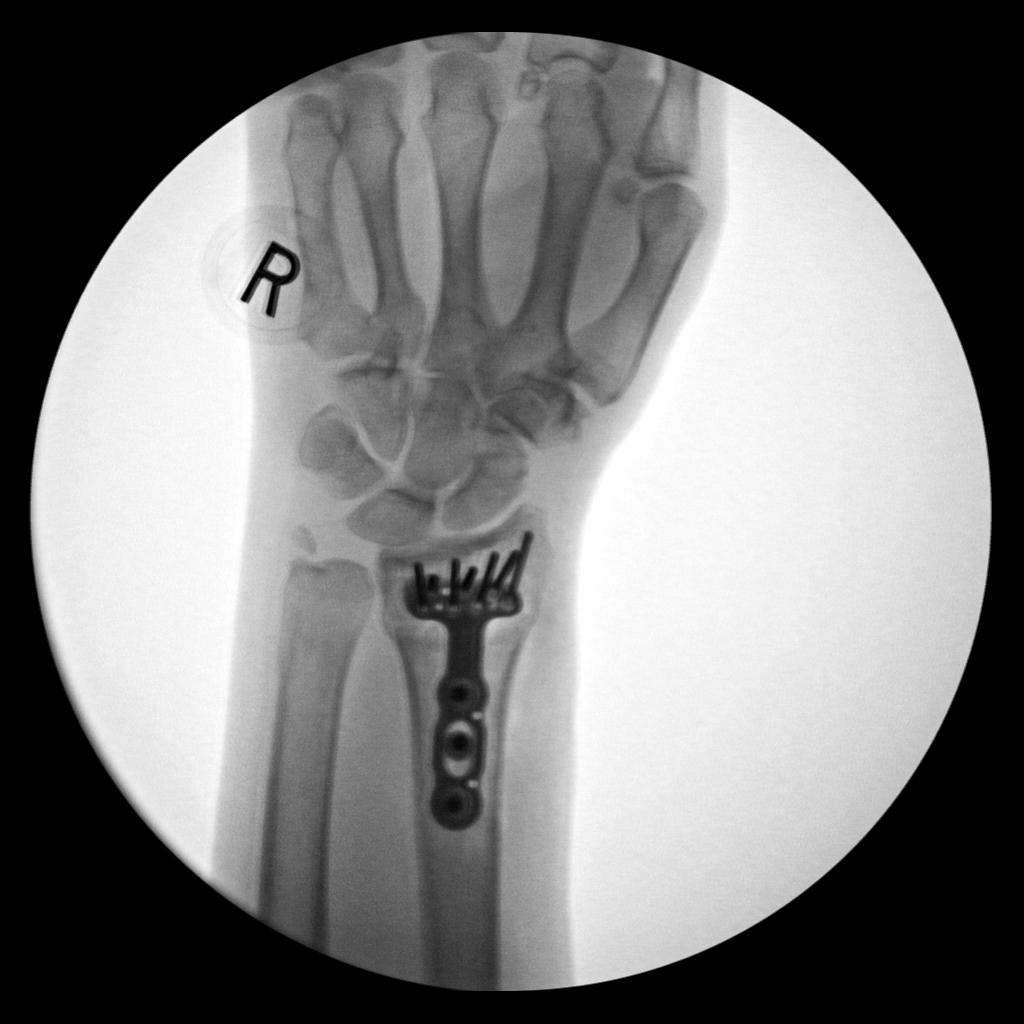
[im 2/3]
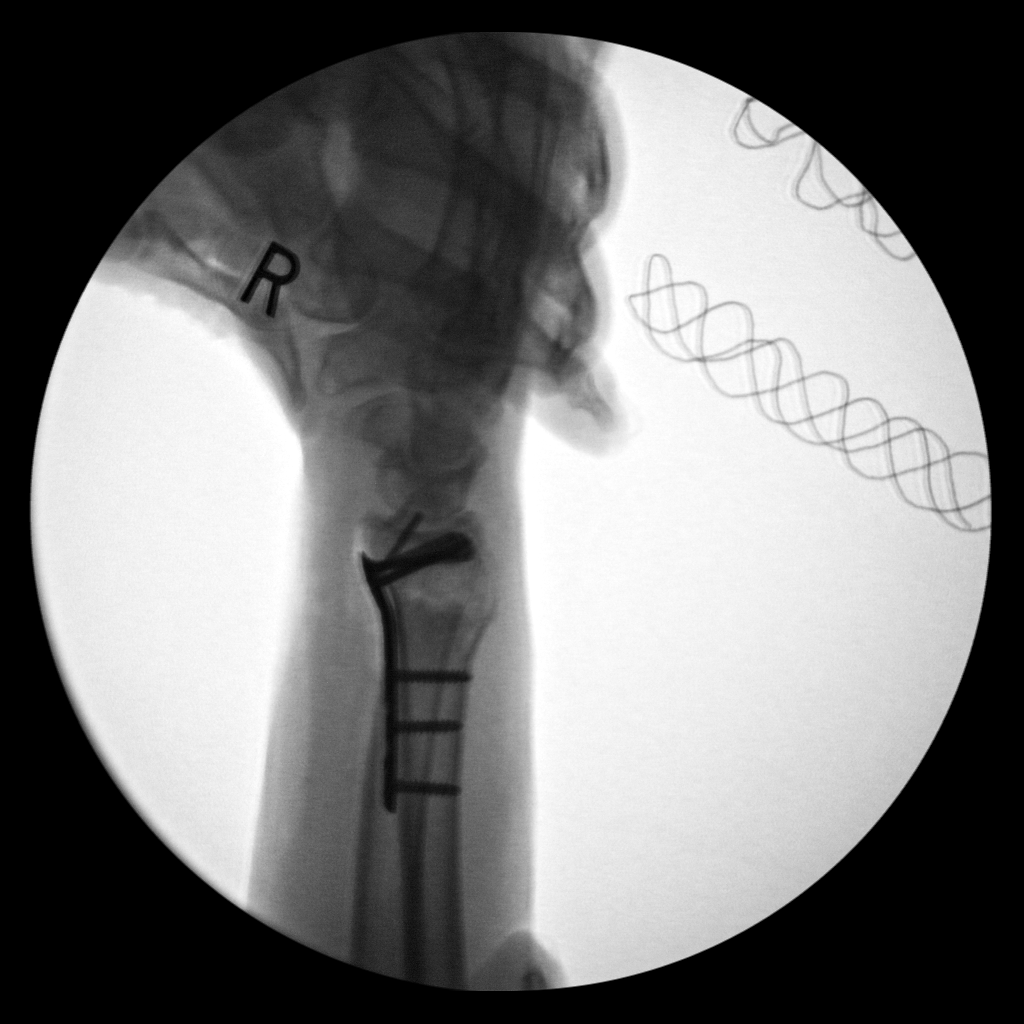
[im 3/3]
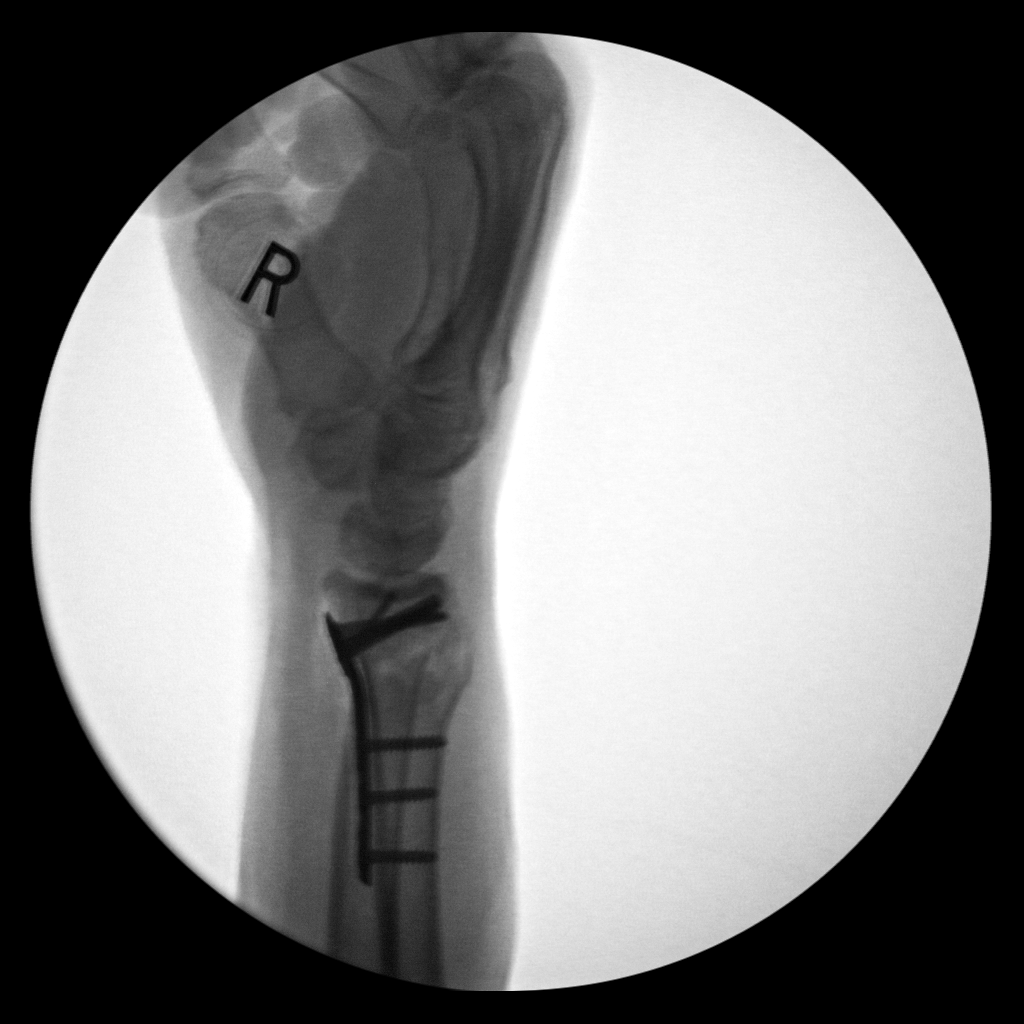

[3 of 3 positions shown; findings below may reference images not displayed]

FINDINGS: Three C-arm fluoroscopic images were obtained intraoperatively and
submitted for post operative interpretation. These images
demonstrate plate and screw fixation of a distal radial fracture.
Alignment is near anatomic. Please see the performing provider's
procedural report for further detail.
IMPRESSION: Intraoperative fluoroscopy, as detailed above.

## 2023-04-07 DIAGNOSIS — K080 Exfoliation of teeth due to systemic causes: Secondary | ICD-10-CM | POA: Diagnosis not present

## 2023-05-13 DIAGNOSIS — H43813 Vitreous degeneration, bilateral: Secondary | ICD-10-CM | POA: Diagnosis not present

## 2023-05-13 DIAGNOSIS — H353231 Exudative age-related macular degeneration, bilateral, with active choroidal neovascularization: Secondary | ICD-10-CM | POA: Diagnosis not present

## 2023-05-13 DIAGNOSIS — H35372 Puckering of macula, left eye: Secondary | ICD-10-CM | POA: Diagnosis not present

## 2023-05-27 DIAGNOSIS — N400 Enlarged prostate without lower urinary tract symptoms: Secondary | ICD-10-CM | POA: Diagnosis not present

## 2023-05-27 DIAGNOSIS — Z136 Encounter for screening for cardiovascular disorders: Secondary | ICD-10-CM | POA: Diagnosis not present

## 2023-05-27 DIAGNOSIS — Z0000 Encounter for general adult medical examination without abnormal findings: Secondary | ICD-10-CM | POA: Diagnosis not present

## 2023-05-27 DIAGNOSIS — R7303 Prediabetes: Secondary | ICD-10-CM | POA: Diagnosis not present

## 2023-05-27 DIAGNOSIS — E559 Vitamin D deficiency, unspecified: Secondary | ICD-10-CM | POA: Diagnosis not present

## 2023-05-27 DIAGNOSIS — Z13220 Encounter for screening for lipoid disorders: Secondary | ICD-10-CM | POA: Diagnosis not present

## 2023-07-16 DIAGNOSIS — H52222 Regular astigmatism, left eye: Secondary | ICD-10-CM | POA: Diagnosis not present

## 2023-07-16 DIAGNOSIS — H353222 Exudative age-related macular degeneration, left eye, with inactive choroidal neovascularization: Secondary | ICD-10-CM | POA: Diagnosis not present

## 2023-07-16 DIAGNOSIS — H5203 Hypermetropia, bilateral: Secondary | ICD-10-CM | POA: Diagnosis not present

## 2023-07-16 DIAGNOSIS — Z961 Presence of intraocular lens: Secondary | ICD-10-CM | POA: Diagnosis not present

## 2023-07-16 DIAGNOSIS — H353213 Exudative age-related macular degeneration, right eye, with inactive scar: Secondary | ICD-10-CM | POA: Diagnosis not present

## 2023-07-16 DIAGNOSIS — H524 Presbyopia: Secondary | ICD-10-CM | POA: Diagnosis not present

## 2023-07-16 DIAGNOSIS — H43813 Vitreous degeneration, bilateral: Secondary | ICD-10-CM | POA: Diagnosis not present

## 2023-09-02 DIAGNOSIS — H35372 Puckering of macula, left eye: Secondary | ICD-10-CM | POA: Diagnosis not present

## 2023-09-02 DIAGNOSIS — H43813 Vitreous degeneration, bilateral: Secondary | ICD-10-CM | POA: Diagnosis not present

## 2023-09-02 DIAGNOSIS — H353231 Exudative age-related macular degeneration, bilateral, with active choroidal neovascularization: Secondary | ICD-10-CM | POA: Diagnosis not present

## 2023-10-15 DIAGNOSIS — K080 Exfoliation of teeth due to systemic causes: Secondary | ICD-10-CM | POA: Diagnosis not present

## 2023-10-19 IMAGING — XA IR EMBO TUMOR ORGAN ISCHEMIA INFARCT INC GUIDE ROADMAPPING
3 series · 16 of 24 positions shown · non-contrast
Comparison: none

INDICATION: Benign prostatic hyperplasia with significant lower urinary tract
symptoms. Patient presents for prostatic artery embolization.

[Series 450: axial spin 1 · axial · 1.9mm · 0.48mm/px · z∈[-946,-764]mm · 5 of 114 slices shown]
[im 1/114]
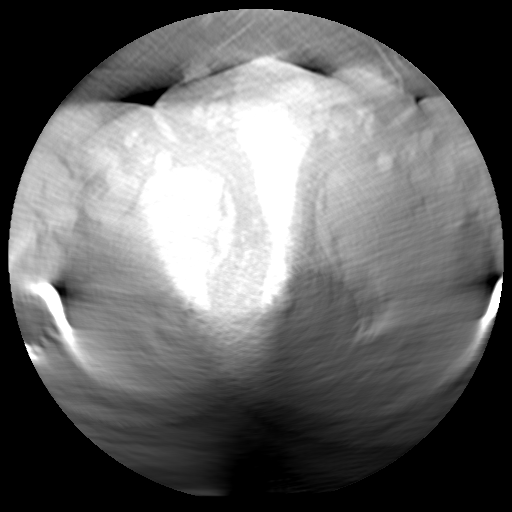
[im 33/114]
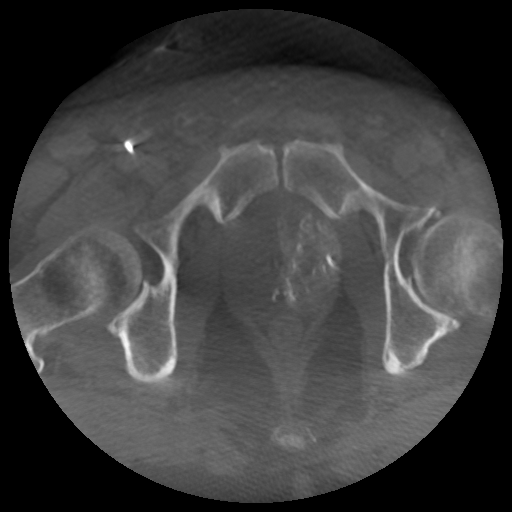
[im 49/114]
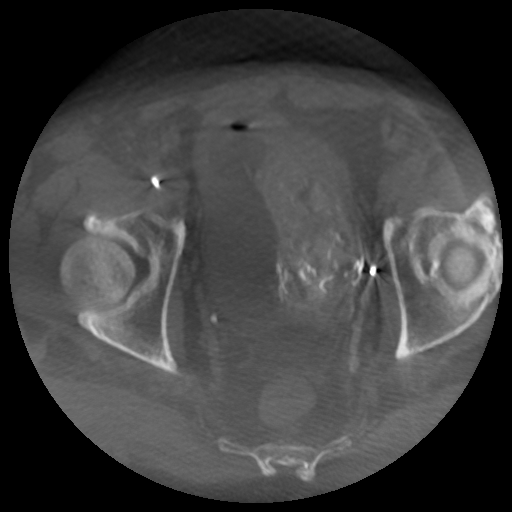
[im 81/114]
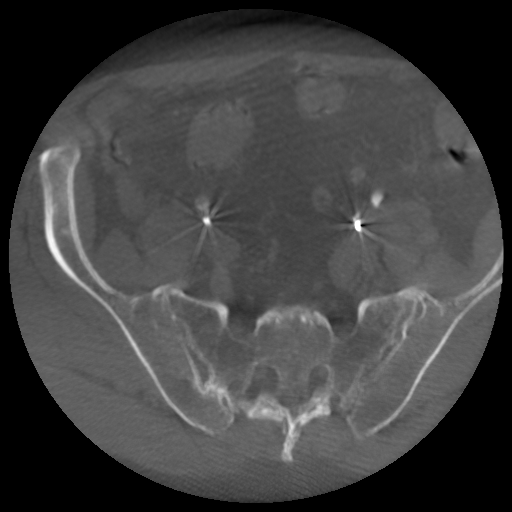
[im 97/114]
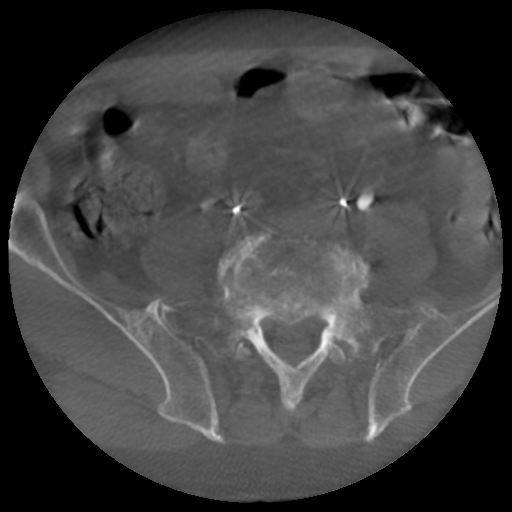

[Series 650: axial spin 2 · axial · 1.9mm · 0.48mm/px · z∈[-946,-732]mm · 6 of 114 slices shown]
[im 1/114]
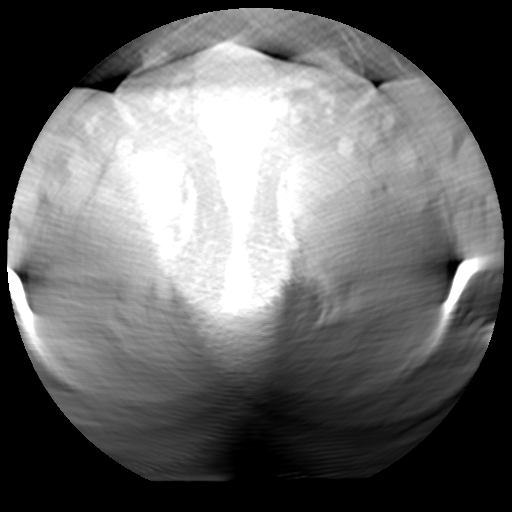
[im 17/114]
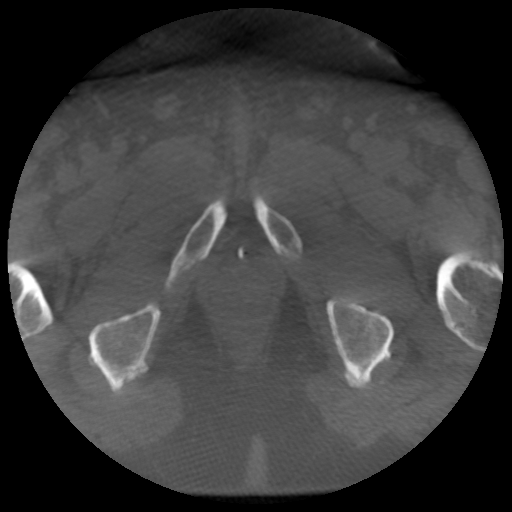
[im 49/114]
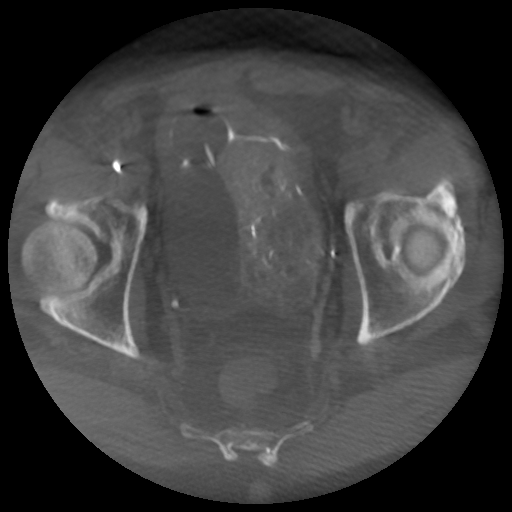
[im 65/114]
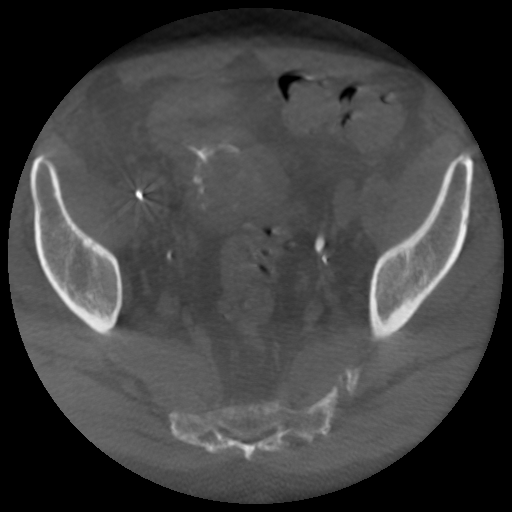
[im 97/114]
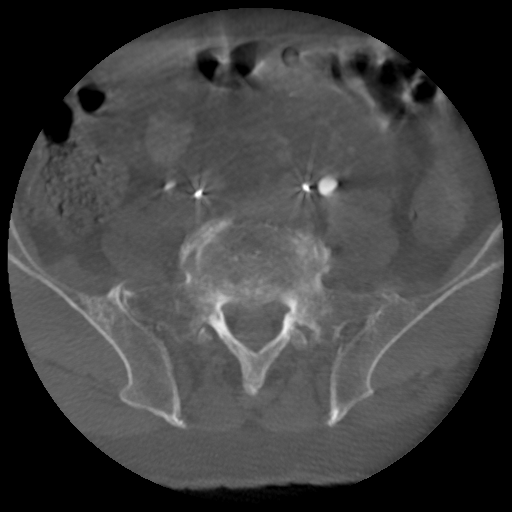
[im 114/114]
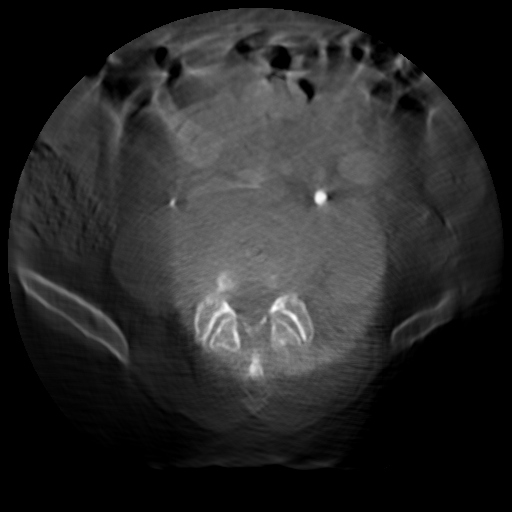

[Series 952: axial spin 3 · axial · 1.9mm · 0.48mm/px · z∈[-914,-729]mm · 5 of 112 slices shown]
[im 16/112]
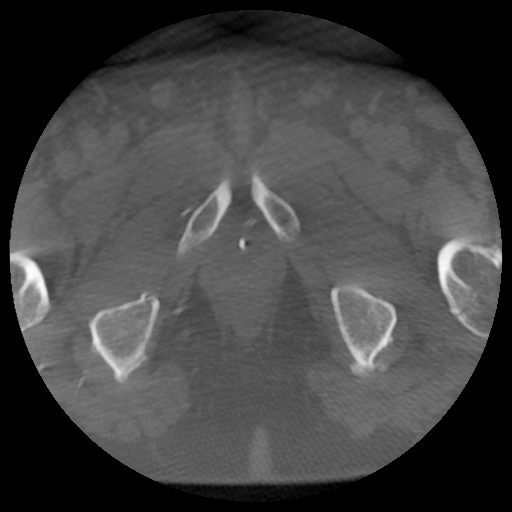
[im 32/112]
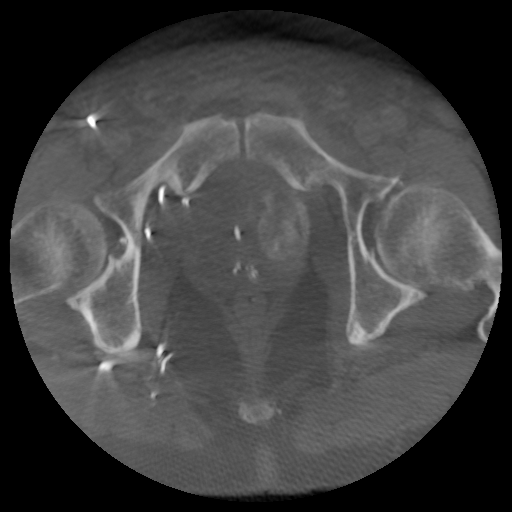
[im 64/112]
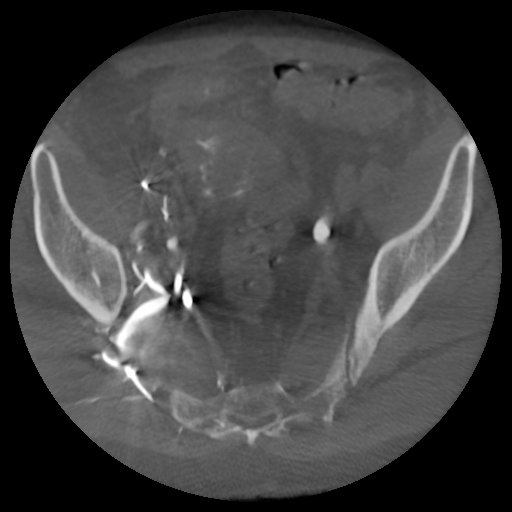
[im 80/112]
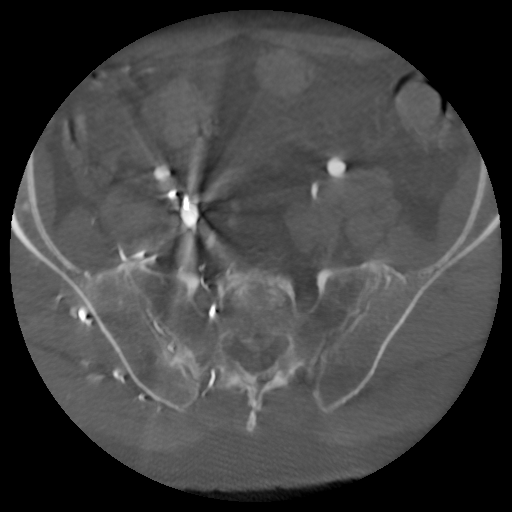
[im 112/112]
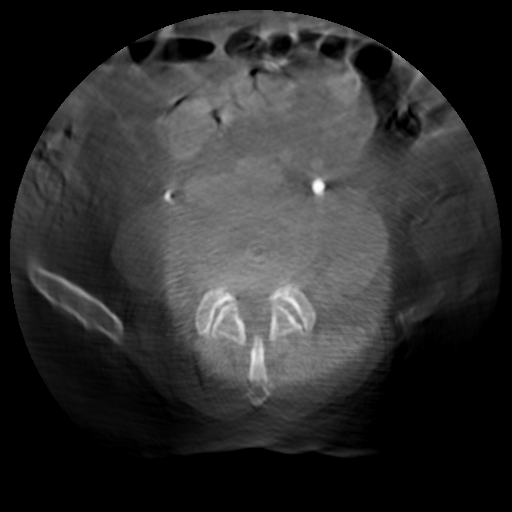

[16 of 24 positions shown; findings below may reference images not displayed]

EXAM:
IR EMBO TUMOR ORGAN ISCHEMIA INFARCT INC GUIDE ROADMAPPING; PELVIC
SELECTIVE ARTERIOGRAPHY; IR ULTRASOUND GUIDANCE VASC ACCESS RIGHT;
ADDITIONAL ARTERIOGRAPHY; WORKSTATION 3D RECONSTRUCTION

MEDICATIONS:
Bactrim 80/400 mg. The antibiotic was administered within 1 hour of
the procedure

ANESTHESIA/SEDATION:
Moderate (conscious) sedation was employed during this procedure. A
total of Versed 5 mg and Fentanyl 125 mcg was administered
intravenously.

Moderate Sedation Time: 141 minutes. The patient's level of
consciousness and vital signs were monitored continuously by
radiology nursing throughout the procedure under my direct
supervision.

CONTRAST:  50mL OMNIPAQUE IOHEXOL 300 MG/ML SOLN, 50mL OMNIPAQUE
IOHEXOL 300 MG/ML SOLN, 60mL OMNIPAQUE IOHEXOL 300 MG/ML SOLN

FLUOROSCOPY TIME:  Fluoroscopy Time: 36 minutes 42 seconds (1148
mGy).

COMPLICATIONS:
None immediate.



The right common femoral artery was interrogated with ultrasound and
found to be widely patent. An image was obtained and stored for the
medical record. Local anesthesia was attained by infiltration with
1% lidocaine. A small dermatotomy was made. Under real-time
sonographic guidance, the vessel was punctured with a 21 gauge
micropuncture needle. Using standard technique, the initial micro
needle was exchanged over a 0.018 micro wire for a transitional 4
French micro sheath. The micro sheath was then exchanged over a
0.035 wire for a 5 French vascular sheath.

A C2 cobra catheter was advanced up in over the aortic bifurcation
and into the left internal iliac artery. An internal iliac
arteriogram was performed in a steep it has a lateral oblique with
mild craniocaudal angulation. The origin of the prostatic artery
appears to arise from a vesicoprostatic trunk. Independent 3D
imaging manipulation was performed on the 3D workstation using EMBO
targeting software.

A 2.5 [place] microcatheter was then successfully
advanced into the proximal aspect of the prostatic artery.
Additional arteriography was performed identifying the anterolateral
and posterolateral branches as well as a more proximal branch that
extends superiorly and may represent an inferior vesicular artery.
Additional angiography was performed using cone beam CT technology.
This confirms that the proximal branch artery is supplying the
bladder wall. Therefore, the microcatheter was advanced more
distally just proximal to the bifurcation into the anterolateral and
posterolateral branches. Repe[REDACTED] beam CT was performed
confirming excellent parenchymal enhancement of the prostate gland
and no non target enhancement.

Particle embolization was then performed using 100-300 micron
embospheres. Following successful embolization the distal prostatic
artery was coil embolized using 2 x 100 mm penumbra [place] low-profile
coils. Repeat arteriography demonstrates an excellent response. No
additional collateral filling of the prostate gland from the left.

The microcatheter system was removed. The Cobra catheter was formed
into [place] loop and used to select the ipsilateral right
internal iliac artery.

Using similar technique, arteriography was performed. The right
prostatic artery arises proximally from the inferior gluteal artery.
The microcatheter was successfully advanced into the proximal
prostatic artery and arteriography was performed. The right
prostatic artery branches more proximally into anterolateral and
posterolateral branches. The microcatheter was advanced into the
anterolateral branch. Contrast injection demonstrates small inferior
vesicular artery branches proximally. The microcatheter was advanced
beyond these. Repeat contrast injection was performed demonstrating
excellent prostatic parenchymal opacification. Particle embolization
was again performed using 100-300 micron embospheres followed by
coil embolization of the prostatic artery.

An attempt was then made to navigate the catheter into the
posterolateral branch, however due to spasm this could not be
achieved well. Given that this branch predominantly supplies the
prostatic capsule and not the median lobe, the decision was made to
end the procedure at this time as all of the median lobe branches
were successfully embolized. The catheter system was removed.

Hemostasis was attained with the assistance of a Celt arterial
closure device.
IMPRESSION: Successful bilateral prostate artery embolization.

## 2023-12-23 DIAGNOSIS — H43813 Vitreous degeneration, bilateral: Secondary | ICD-10-CM | POA: Diagnosis not present

## 2023-12-23 DIAGNOSIS — H353213 Exudative age-related macular degeneration, right eye, with inactive scar: Secondary | ICD-10-CM | POA: Diagnosis not present

## 2023-12-23 DIAGNOSIS — H353221 Exudative age-related macular degeneration, left eye, with active choroidal neovascularization: Secondary | ICD-10-CM | POA: Diagnosis not present

## 2023-12-23 DIAGNOSIS — H35372 Puckering of macula, left eye: Secondary | ICD-10-CM | POA: Diagnosis not present

## 2023-12-24 ENCOUNTER — Telehealth (HOSPITAL_COMMUNITY): Payer: Self-pay | Admitting: Student

## 2023-12-24 DIAGNOSIS — H353213 Exudative age-related macular degeneration, right eye, with inactive scar: Secondary | ICD-10-CM | POA: Diagnosis not present

## 2023-12-24 DIAGNOSIS — H353221 Exudative age-related macular degeneration, left eye, with active choroidal neovascularization: Secondary | ICD-10-CM | POA: Diagnosis not present

## 2023-12-24 MED ORDER — TAMSULOSIN HCL 0.4 MG PO CAPS: 0.4000 mg | ORAL_CAPSULE | Freq: Every day | ORAL | 4 refills | Status: AC

## 2023-12-24 NOTE — Telephone Encounter (Signed)
 Tamsulosin  e-prescribed to [facility] on [address].   Warren Dais, AGACNP-BC 12/24/2023, 10:07 AM
